# Patient Record
Sex: Male | Born: 1950 | Race: White | Hispanic: No | Marital: Married | Smoking: Never smoker
Health system: Southern US, Community
[De-identification: ages and names within clinical notes are randomized; demographics above are authoritative.]

## PROBLEM LIST (undated history)

## (undated) DIAGNOSIS — I499 Cardiac arrhythmia, unspecified: Secondary | ICD-10-CM

## (undated) DIAGNOSIS — Z87442 Personal history of urinary calculi: Secondary | ICD-10-CM

## (undated) DIAGNOSIS — K219 Gastro-esophageal reflux disease without esophagitis: Secondary | ICD-10-CM

## (undated) DIAGNOSIS — M1990 Unspecified osteoarthritis, unspecified site: Secondary | ICD-10-CM

## (undated) DIAGNOSIS — I10 Essential (primary) hypertension: Secondary | ICD-10-CM

## (undated) DIAGNOSIS — D649 Anemia, unspecified: Secondary | ICD-10-CM

## (undated) DIAGNOSIS — M1711 Unilateral primary osteoarthritis, right knee: Secondary | ICD-10-CM

## (undated) DIAGNOSIS — Z8619 Personal history of other infectious and parasitic diseases: Secondary | ICD-10-CM

## (undated) HISTORY — DX: Essential (primary) hypertension: I10

## (undated) HISTORY — PX: TONSILLECTOMY: SUR1361

## (undated) HISTORY — PX: OTHER SURGICAL HISTORY: SHX169

## (undated) HISTORY — PX: CYST EXCISION: SHX5701

## (undated) HISTORY — PX: KNEE ARTHROSCOPY: SUR90

---

## 2016-03-21 ENCOUNTER — Encounter (INDEPENDENT_AMBULATORY_CARE_PROVIDER_SITE_OTHER): Payer: Self-pay | Admitting: Internal Medicine

## 2016-03-22 ENCOUNTER — Other Ambulatory Visit (INDEPENDENT_AMBULATORY_CARE_PROVIDER_SITE_OTHER): Payer: Self-pay | Admitting: Internal Medicine

## 2016-03-22 ENCOUNTER — Encounter (INDEPENDENT_AMBULATORY_CARE_PROVIDER_SITE_OTHER): Payer: Self-pay | Admitting: *Deleted

## 2016-03-22 ENCOUNTER — Ambulatory Visit (INDEPENDENT_AMBULATORY_CARE_PROVIDER_SITE_OTHER): Payer: Medicare Other | Admitting: Internal Medicine

## 2016-03-22 ENCOUNTER — Telehealth (INDEPENDENT_AMBULATORY_CARE_PROVIDER_SITE_OTHER): Payer: Self-pay | Admitting: *Deleted

## 2016-03-22 ENCOUNTER — Encounter (INDEPENDENT_AMBULATORY_CARE_PROVIDER_SITE_OTHER): Payer: Self-pay | Admitting: Internal Medicine

## 2016-03-22 ENCOUNTER — Encounter (INDEPENDENT_AMBULATORY_CARE_PROVIDER_SITE_OTHER): Payer: Self-pay

## 2016-03-22 VITALS — BP 180/80 | HR 72 | Temp 98.2°F | Ht 71.0 in | Wt 260.0 lb

## 2016-03-22 DIAGNOSIS — D62 Acute posthemorrhagic anemia: Secondary | ICD-10-CM

## 2016-03-22 DIAGNOSIS — R195 Other fecal abnormalities: Secondary | ICD-10-CM

## 2016-03-22 DIAGNOSIS — Z800 Family history of malignant neoplasm of digestive organs: Secondary | ICD-10-CM

## 2016-03-22 DIAGNOSIS — Z860100 Personal history of colon polyps, unspecified: Secondary | ICD-10-CM

## 2016-03-22 DIAGNOSIS — I10 Essential (primary) hypertension: Secondary | ICD-10-CM | POA: Insufficient documentation

## 2016-03-22 HISTORY — DX: Essential (primary) hypertension: I10

## 2016-03-22 MED ORDER — PEG 3350-KCL-NA BICARB-NACL 420 G PO SOLR: 4000.0000 mL | Freq: Once | ORAL | 0 refills | Status: AC

## 2016-03-22 NOTE — Patient Instructions (Signed)
EGD/Colonoscopy. The risks and benefits such as perforation, bleeding, and infection were reviewed with the patient and is agreeable. 

## 2016-03-22 NOTE — Telephone Encounter (Signed)
Patient needs trilyte 

## 2016-03-22 NOTE — Progress Notes (Signed)
   Subjective:    Patient ID: Jack Elliott, male    DOB: Nov 09, 1950, 65 y.o.   MRN: [id number]  HPI Referred by Shari Prows FNP for anemia, heme positive stools.(2). He denies prior hx of IDA.  He denies seeing any rectal bleeding.  Thee has been no weight loss. He usually has a BM x 2 a day.  Family hx of colon cancer in an aunt an uncle. Mother had a colon cancer (small intestines). Age 91 02/18/2016 Ferritin 3.5. 02/18/2016 H and H 12.2 and 38.9, MCV 80.9,  platelet ct 219 He says he would take Naproxen about 3 times a week when he worked in the year for arthritis in his knees.  His last colonoscopy was in August of 2014 by DR. Spainhour.  Family hx of colon cancer. Hx of adenoma.  Next colonoscopy in 5 yrs.  Biopsy: exhibits polypoid fragments with prominent adenomatous change. No tumor seen . Review of Systems Past Medical History:  Diagnosis Date  . Essential hypertension 03/22/2016    Past Surgical History:  Procedure Laterality Date  . kidney stone surgery with laser      Allergies  Allergen Reactions  . Penicillins     Rash,     No current outpatient prescriptions on file prior to visit.   No current facility-administered medications on file prior to visit.    Current Outpatient Prescriptions  Medication Sig Dispense Refill  . cholecalciferol (VITAMIN D) 1000 units tablet Take 1,000 Units by mouth daily.    Marland Kitchen Fe Bisgly-Succ-C-Thre-B12-FA (IRON-150 PO) Take 64 mg by mouth.    Marland Kitchen HYDROcodone-acetaminophen (NORCO/VICODIN) 5-325 MG tablet Take 1 tablet by mouth every 6 (six) hours as needed for moderate pain.    . naproxen (NAPROSYN) 500 MG tablet Take 500 mg by mouth as needed.    . ranitidine (ZANTAC) 75 MG tablet Take 75 mg by mouth as needed for heartburn.    . valsartan-hydrochlorothiazide (DIOVAN-HCT) 80-12.5 MG tablet Take 1 tablet by mouth daily.    . vitamin B-12 (CYANOCOBALAMIN) 250 MCG tablet Take 2,500 mcg by mouth daily.     No current  facility-administered medications for this visit.        Objective:   Physical Exam Blood pressure (!) 180/80, pulse 72, temperature 98.2 F (36.8 C), height 5\' 11"  (1.803 m), weight 260 lb (117.9 kg). Alert and oriented. Skin warm and dry. Oral mucosa is moist.   . Sclera anicteric, conjunctivae is pink. Thyroid not enlarged. No cervical lymphadenopathy. Lungs clear. Heart regular rate and rhythm.  Abdomen is soft. Bowel sounds are positive. No hepatomegaly. No abdominal masses felt. No tenderness.  No edema to lower extremities.         Assessment & Plan:  Anemia, Guaiac positive stool. Family hx of colon cancer.  Hx of colon adenoma  I discussed with Dr. Laural Golden. EGD/Colonoscopy. The risks and benefits such as perforation, bleeding, and infection were reviewed with the patient and is agreeable.

## 2016-03-23 DIAGNOSIS — Z800 Family history of malignant neoplasm of digestive organs: Secondary | ICD-10-CM | POA: Insufficient documentation

## 2016-03-23 DIAGNOSIS — Z86010 Personal history of colonic polyps: Secondary | ICD-10-CM | POA: Insufficient documentation

## 2016-03-23 DIAGNOSIS — R195 Other fecal abnormalities: Secondary | ICD-10-CM | POA: Insufficient documentation

## 2016-03-23 DIAGNOSIS — D62 Acute posthemorrhagic anemia: Secondary | ICD-10-CM | POA: Insufficient documentation

## 2016-04-29 ENCOUNTER — Encounter (HOSPITAL_COMMUNITY): Payer: Self-pay | Admitting: *Deleted

## 2016-04-29 ENCOUNTER — Encounter (HOSPITAL_COMMUNITY)
Admission: RE | Disposition: A | Discharge status: Home / Self Care | Payer: Self-pay | Source: Ambulatory Visit | Attending: Internal Medicine

## 2016-04-29 ENCOUNTER — Ambulatory Visit (HOSPITAL_COMMUNITY)
Admission: RE | Admit: 2016-04-29 | Discharge: 2016-04-29 | Disposition: A | Discharge status: Home / Self Care | Payer: Medicare Other | Source: Ambulatory Visit | Attending: Internal Medicine | Admitting: Internal Medicine

## 2016-04-29 DIAGNOSIS — R195 Other fecal abnormalities: Secondary | ICD-10-CM | POA: Diagnosis not present

## 2016-04-29 DIAGNOSIS — Z860100 Personal history of colon polyps, unspecified: Secondary | ICD-10-CM

## 2016-04-29 DIAGNOSIS — D62 Acute posthemorrhagic anemia: Secondary | ICD-10-CM

## 2016-04-29 DIAGNOSIS — K644 Residual hemorrhoidal skin tags: Secondary | ICD-10-CM | POA: Insufficient documentation

## 2016-04-29 DIAGNOSIS — Z79899 Other long term (current) drug therapy: Secondary | ICD-10-CM | POA: Diagnosis not present

## 2016-04-29 DIAGNOSIS — D509 Iron deficiency anemia, unspecified: Secondary | ICD-10-CM | POA: Insufficient documentation

## 2016-04-29 DIAGNOSIS — I10 Essential (primary) hypertension: Secondary | ICD-10-CM | POA: Diagnosis not present

## 2016-04-29 DIAGNOSIS — Z791 Long term (current) use of non-steroidal anti-inflammatories (NSAID): Secondary | ICD-10-CM | POA: Diagnosis not present

## 2016-04-29 DIAGNOSIS — K5730 Diverticulosis of large intestine without perforation or abscess without bleeding: Secondary | ICD-10-CM | POA: Diagnosis not present

## 2016-04-29 DIAGNOSIS — Z800 Family history of malignant neoplasm of digestive organs: Secondary | ICD-10-CM

## 2016-04-29 HISTORY — PX: ESOPHAGOGASTRODUODENOSCOPY: SHX5428

## 2016-04-29 HISTORY — PX: COLONOSCOPY: SHX5424

## 2016-04-29 SURGERY — EGD (ESOPHAGOGASTRODUODENOSCOPY)
Anesthesia: Moderate Sedation

## 2016-04-29 MED ORDER — SODIUM CHLORIDE 0.9 % IV SOLN
INTRAVENOUS | Status: DC
  Administered 2016-04-29: 1000 mL via INTRAVENOUS

## 2016-04-29 MED ORDER — MIDAZOLAM HCL 5 MG/5ML IJ SOLN
INTRAMUSCULAR | Status: AC
  Filled 2016-04-29: qty 10

## 2016-04-29 MED ORDER — MIDAZOLAM HCL 5 MG/5ML IJ SOLN
INTRAMUSCULAR | Status: DC | PRN
  Administered 2016-04-29: 2 mg via INTRAVENOUS
  Administered 2016-04-29: 1 mg via INTRAVENOUS
  Administered 2016-04-29: 2 mg via INTRAVENOUS

## 2016-04-29 MED ORDER — MEPERIDINE HCL 50 MG/ML IJ SOLN
INTRAMUSCULAR | Status: DC | PRN
  Administered 2016-04-29 (×2): 25 mg via INTRAVENOUS

## 2016-04-29 MED ORDER — BUTAMBEN-TETRACAINE-BENZOCAINE 2-2-14 % EX AERO
INHALATION_SPRAY | CUTANEOUS | Status: DC | PRN
  Administered 2016-04-29: 2 via TOPICAL

## 2016-04-29 MED ORDER — MEPERIDINE HCL 50 MG/ML IJ SOLN
INTRAMUSCULAR | Status: AC
  Filled 2016-04-29: qty 1

## 2016-04-29 NOTE — H&P (Addendum)
Jack Elliott is an 65 y.o. male.   Chief Complaint: Patient is here for EGD and colonoscopy. HPI: 65 year old Caucasian male who was recently found to have heme-positive stool and iron deficiency anemia and he has responded to by mouth iron. He denies melena or rectal bleeding. There is no history of peptic ulcer disease but he had been taking Naprosyn when necessary basis. He rarely takes more than 3 times a week.. He has had occasional heartburn. He denies dysphagia nausea vomiting anorexia weight loss. Os colonoscopy was about 3 years ago with one of single tubular adenoma. History significant for CRC in paternal uncle was diagnosed in his 21s.  Past Medical History:  Diagnosis Date  . Essential hypertension 03/22/2016    Past Surgical History:  Procedure Laterality Date  . kidney stone surgery with laser    . KNEE ARTHROSCOPY      Family History  Problem Relation Age of Onset  . Multiple sclerosis Mother   . Breast cancer Mother   . Bladder Cancer Father   . Emphysema Father    Social History:  reports that he has never smoked. He has never used smokeless tobacco. He reports that he does not drink alcohol or use drugs.  Allergies:  Allergies  Allergen Reactions  . Penicillins Rash    Has patient had a PCN reaction causing immediate rash, facial/tongue/throat swelling, SOB or lightheadedness with hypotension: Yes Has patient had a PCN reaction causing severe rash involving mucus membranes or skin necrosis: No Has patient had a PCN reaction that required hospitalization No Has patient had a PCN reaction occurring within the last 10 years: No If all of the above answers are "NO", then may proceed with Cephalosporin use.     Medications Prior to Admission  Medication Sig Dispense Refill  . cholecalciferol (VITAMIN D) 1000 units tablet Take 1,000 Units by mouth daily.    . Cyanocobalamin (B-12) 2500 MCG TABS Take 2,500 mcg by mouth daily.    . ferrous sulfate 325 (65 FE) MG  tablet Take 325 mg by mouth at bedtime.     . naproxen (NAPROSYN) 500 MG tablet Take 500 mg by mouth every other day.     . ranitidine (ZANTAC) 75 MG tablet Take 75 mg by mouth as needed for heartburn.    . valsartan-hydrochlorothiazide (DIOVAN-HCT) 80-12.5 MG tablet Take 1 tablet by mouth daily.    . vitamin B-12 (CYANOCOBALAMIN) 250 MCG tablet Take 2,500 mcg by mouth daily.    Marland Kitchen HYDROcodone-acetaminophen (NORCO/VICODIN) 5-325 MG tablet Take 1 tablet by mouth every 6 (six) hours as needed for moderate pain.      No results found for this or any previous visit (from the past 48 hour(s)). No results found.  ROS  Blood pressure 140/61, pulse 99, temperature 98.7 F (37.1 C), temperature source Oral, resp. rate 20, height 5\' 11"  (1.803 m), weight 249 lb (112.9 kg), SpO2 97 %. Physical Exam  Constitutional: He appears well-developed and well-nourished.  HENT:  Mouth/Throat: Oropharynx is clear and moist.  Eyes: Conjunctivae are normal. No scleral icterus.  Neck: No thyromegaly present.  Cardiovascular: Normal rate, regular rhythm and normal heart sounds.   No murmur heard. Respiratory: Effort normal and breath sounds normal.  GI:  Abdomen is full but soft and nontender without organomegaly or masses.  Lymphadenopathy:    He has no cervical adenopathy.     Assessment/Plan Heme positive stool. Iron deficiency anemia. History of colonic adenoma. Diagnostic EGD and colonoscopy.  [employer],  MD 04/29/2016, 2:43 PM

## 2016-04-29 NOTE — Discharge Instructions (Signed)
Resume usual medications and high fiber diet. Use Naprosyn and Zantac on as-needed basis. Notify if you have rectal bleeding or black or tarry stools. CBC and stool guaiac when you see your primary care physician in February 2018. No driving for 24 hours. Next colonoscopy in 5 years.      Esophagogastroduodenoscopy, Care After Refer to this sheet in the next few weeks. These instructions provide you with information about caring for yourself after your procedure. Your health care provider may also give you more specific instructions. Your treatment has been planned according to current medical practices, but problems sometimes occur. Call your health care provider if you have any problems or questions after your procedure. WHAT TO EXPECT AFTER THE PROCEDURE After your procedure, it is typical to feel:  Soreness in your throat.  Pain with swallowing.  Sick to your stomach (nauseous).  Bloated.  Dizzy.  Fatigued. HOME CARE INSTRUCTIONS  Do not eat or drink anything until the numbing medicine (local anesthetic) has worn off and your gag reflex has returned. You will know that the local anesthetic has worn off when you can swallow comfortably.  Do not drive or operate machinery until directed by your health care provider.  Take medicines only as directed by your health care provider. SEEK MEDICAL CARE IF:   You cannot stop coughing.  You are not urinating at all or less than usual. SEEK IMMEDIATE MEDICAL CARE IF:  You have difficulty swallowing.  You cannot eat or drink.  You have worsening throat or chest pain.  You have dizziness or lightheadedness or you faint.  You have nausea or vomiting.  You have chills.  You have a fever.  You have severe abdominal pain.  You have black, tarry, or bloody stools.   This information is not intended to replace advice given to you by your health care provider. Make sure you discuss any questions you have with your health  care provider.   Document Released: 06/13/2012 Document Revised: 07/18/2014 Document Reviewed: 06/13/2012 Elsevier Interactive Patient Education 2016 [employer].     Colonoscopy, Care After These instructions give you information on caring for yourself after your procedure. Your doctor may also give you more specific instructions. Call your doctor if you have any problems or questions after your procedure. HOME CARE  Do not drive for 24 hours.  Do not sign important papers or use machinery for 24 hours.  You may shower.  You may go back to your usual activities, but go slower for the first 24 hours.  Take rest breaks often during the first 24 hours.  Walk around or use warm packs on your belly (abdomen) if you have belly cramping or gas.  Drink enough fluids to keep your pee (urine) clear or pale yellow.  Resume your normal diet. Avoid heavy or fried foods.  Avoid drinking alcohol for 24 hours or as told by your doctor.  Only take medicines as told by your doctor. If a tissue sample (biopsy) was taken during the procedure:   Do not take aspirin or blood thinners for 7 days, or as told by your doctor.  Do not drink alcohol for 7 days, or as told by your doctor.  Eat soft foods for the first 24 hours. GET HELP IF: You still have a small amount of blood in your poop (stool) 2-3 days after the procedure. GET HELP RIGHT AWAY IF:  You have more than a small amount of blood in your poop.  You see  clumps of tissue (blood clots) in your poop.  Your belly is puffy (swollen).  You feel sick to your stomach (nauseous) or throw up (vomit).  You have a fever.  You have belly pain that gets worse and medicine does not help. MAKE SURE YOU:  Understand these instructions.  Will watch your condition.  Will get help right away if you are not doing well or get worse.   This information is not intended to replace advice given to you by your health care provider. Make sure  you discuss any questions you have with your health care provider.   Document Released: 07/30/2010 Document Revised: 07/02/2013 Document Reviewed: 03/04/2013 Elsevier Interactive Patient Education [employer].    Diverticulosis Diverticulosis is the condition that develops when small pouches (diverticula) form in the wall of your colon. Your colon, or large intestine, is where water is absorbed and stool is formed. The pouches form when the inside layer of your colon pushes through weak spots in the outer layers of your colon. CAUSES  No one knows exactly what causes diverticulosis. RISK FACTORS  Being older than 8. Your risk for this condition increases with age. Diverticulosis is rare in people younger than 40 years. By age 45, almost everyone has it.  Eating a low-fiber diet.  Being frequently constipated.  Being overweight.  Not getting enough exercise.  Smoking.  Taking over-the-counter pain medicines, like aspirin and ibuprofen. SYMPTOMS  Most people with diverticulosis do not have symptoms. DIAGNOSIS  Because diverticulosis often has no symptoms, health care providers often discover the condition during an exam for other colon problems. In many cases, a health care provider will diagnose diverticulosis while using a flexible scope to examine the colon (colonoscopy). TREATMENT  If you have never developed an infection related to diverticulosis, you may not need treatment. If you have had an infection before, treatment may include:  Eating more fruits, vegetables, and grains.  Taking a fiber supplement.  Taking a live bacteria supplement (probiotic).  Taking medicine to relax your colon. HOME CARE INSTRUCTIONS   Drink at least 6-8 glasses of water each day to prevent constipation.  Try not to strain when you have a bowel movement.  Keep all follow-up appointments. If you have had an infection before:  Increase the fiber in your diet as directed by your  health care provider or dietitian.  Take a dietary fiber supplement if your health care provider approves.  Only take medicines as directed by your health care provider. SEEK MEDICAL CARE IF:   You have abdominal pain.  You have bloating.  You have cramps.  You have not gone to the bathroom in 3 days. SEEK IMMEDIATE MEDICAL CARE IF:   Your pain gets worse.  Yourbloating becomes very bad.  You have a fever or chills, and your symptoms suddenly get worse.  You begin vomiting.  You have bowel movements that are bloody or black. MAKE SURE YOU:  Understand these instructions.  Will watch your condition.  Will get help right away if you are not doing well or get worse.   This information is not intended to replace advice given to you by your health care provider. Make sure you discuss any questions you have with your health care provider.   Document Released: 03/24/2004 Document Revised: 07/02/2013 Document Reviewed: 05/22/2013 Elsevier Interactive Patient Education 2016 Elsevier Inc.    High-Fiber Diet Fiber, also called dietary fiber, is a type of carbohydrate found in fruits, vegetables, whole grains, and  beans. A high-fiber diet can have many health benefits. Your health care provider may recommend a high-fiber diet to help:  Prevent constipation. Fiber can make your bowel movements more regular.  Lower your cholesterol.  Relieve hemorrhoids, uncomplicated diverticulosis, or irritable bowel syndrome.  Prevent overeating as part of a weight-loss plan.  Prevent heart disease, type 2 diabetes, and certain cancers. WHAT IS MY PLAN? The recommended daily intake of fiber includes:  38 grams for men under age 57.  49 grams for men over age 83.  34 grams for women under age 56.  35 grams for women over age 61. You can get the recommended daily intake of dietary fiber by eating a variety of fruits, vegetables, grains, and beans. Your health care provider may also  recommend a fiber supplement if it is not possible to get enough fiber through your diet. WHAT DO I NEED TO KNOW ABOUT A HIGH-FIBER DIET?  Fiber supplements have not been widely studied for their effectiveness, so it is better to get fiber through food sources.  Always check the fiber content on thenutrition facts label of any prepackaged food. Look for foods that contain at least 5 grams of fiber per serving.  Ask your dietitian if you have questions about specific foods that are related to your condition, especially if those foods are not listed in the following section.  Increase your daily fiber consumption gradually. Increasing your intake of dietary fiber too quickly may cause bloating, cramping, or gas.  Drink plenty of water. Water helps you to digest fiber. WHAT FOODS CAN I EAT? Grains Whole-grain breads. Multigrain cereal. Oats and oatmeal. Brown rice. Barley. Bulgur wheat. [place]. Bran muffins. Popcorn. Rye wafer crackers. Vegetables Sweet potatoes. Spinach. Kale. Artichokes. Cabbage. Broccoli. Green peas. Carrots. Squash. Fruits Berries. Pears. Apples. Oranges. Avocados. Prunes and raisins. Dried figs. Meats and Other Protein Sources [place], kidney, pinto, and soy beans. Split peas. Lentils. Nuts and seeds. Dairy Fiber-fortified yogurt. Beverages Fiber-fortified soy milk. Fiber-fortified orange juice. Other Fiber bars. The items listed above may not be a complete list of recommended foods or beverages. Contact your dietitian for more options. WHAT FOODS ARE NOT RECOMMENDED? Grains White bread. Pasta made with refined flour. White rice. Vegetables Fried potatoes. Canned vegetables. Well-cooked vegetables.  Fruits Fruit juice. Cooked, strained fruit. Meats and Other Protein Sources Fatty cuts of meat. Fried Sales executive or fried fish. Dairy Milk. Yogurt. Cream cheese. Sour cream. Beverages Soft drinks. Other Cakes and pastries. Butter and oils. The items listed above  may not be a complete list of foods and beverages to avoid. Contact your dietitian for more information. WHAT ARE SOME TIPS FOR INCLUDING HIGH-FIBER FOODS IN MY DIET?  Eat a wide variety of high-fiber foods.  Make sure that half of all grains consumed each day are whole grains.  Replace breads and cereals made from refined flour or white flour with whole-grain breads and cereals.  Replace white rice with brown rice, bulgur wheat, or millet.  Start the day with a breakfast that is high in fiber, such as a cereal that contains at least 5 grams of fiber per serving.  Use beans in place of meat in soups, salads, or pasta.  Eat high-fiber snacks, such as berries, raw vegetables, nuts, or popcorn.   This information is not intended to replace advice given to you by your health care provider. Make sure you discuss any questions you have with your health care provider.   Document Released: 06/27/2005 Document Revised: 07/18/2014 Document  Reviewed: 12/10/2013 °Elsevier Interactive Patient Education ©2016 Elsevier Inc. ° °

## 2016-04-29 NOTE — Op Note (Signed)
[facility] Patient Name: Jack Elliott Procedure Date: 04/29/2016 3:03 PM MRN: [id number] Date of Birth: 1951-03-30 Attending MD: Hildred Laser , MD CSN: [id number] Age: 65 Admit Type: Outpatient Procedure:                Colonoscopy Indications:              Heme positive stool, Iron deficiency anemia Providers:                Hildred Laser, MD, Janeece Riggers, RN, [facility],                            Technician Referring MD:             Laray Anger NP, NP Medicines:                Midazolam 1 mg IV Complications:            No immediate complications. Estimated Blood Loss:     Estimated blood loss: none. Estimated blood loss:                            none. Procedure:                Pre-Anesthesia Assessment:                           - Prior to the procedure, a History and Physical                            was performed, and patient medications and                            allergies were reviewed. The patient's tolerance of                            previous anesthesia was also reviewed. The risks                            and benefits of the procedure and the sedation                            options and risks were discussed with the patient.                            All questions were answered, and informed consent                            was obtained. Prior Anticoagulants: The patient                            last took naproxen 3 days prior to the procedure.                            ASA Grade Assessment: II - A patient with mild  systemic disease. After reviewing the risks and                            benefits, the patient was deemed in satisfactory                            condition to undergo the procedure.                           After obtaining informed consent, the colonoscope                            was passed under direct vision. Throughout the                            procedure, the patient's blood  pressure, pulse, and                            oxygen saturations were monitored continuously. The                            [id number]) was introduced through the                            anus and advanced to the the terminal ileum, with                            identification of the appendiceal orifice and IC                            valve. The colonoscopy was performed without                            difficulty. The patient tolerated the procedure                            well. The quality of the bowel preparation was                            good. The terminal ileum, ileocecal valve,                            appendiceal orifice, and rectum were photographed. Scope In: 3:04:55 PM Scope Out: 3:21:38 PM Scope Withdrawal Time: 0 hours 12 minutes 28 seconds  Total Procedure Duration: 0 hours 16 minutes 43 seconds  Findings:      The terminal ileum appeared normal.      Scattered medium-mouthed diverticula were found in the sigmoid colon.      The exam was otherwise without abnormality.      External hemorrhoids were found during retroflexion. The hemorrhoids       were small. Impression:               - Diverticulosis in the sigmoid colon.                           -  The examination was otherwise normal.                           - External hemorrhoids.                           - No specimens collected. Moderate Sedation:      Moderate (conscious) sedation was administered by the endoscopy nurse       and supervised by the endoscopist. The following parameters were       monitored: oxygen saturation, heart rate, blood pressure, CO2       capnography and response to care. Total physician intraservice time was       20 minutes. Recommendation:           - Patient has a contact number available for                            emergencies. The signs and symptoms of potential                            delayed complications were discussed with the                             patient. Return to normal activities tomorrow.                            Written discharge instructions were provided to the                            patient.                           - High fiber diet today.                           - Continue present medications.                           - Repeat colonoscopy in 5 years for surveillance.                           - H&H and stool guaiacin February 2018 at th.                            Vira Agar FNP.                           Comment: He could've been losing blood from his                            upper or mid GI tract due to chronic NSAID use.                           If he has an evidence of overt GI bleed or H&H  drops again will consider small bowel Given capsule                            study. Procedure Code(s):        --- Professional ---                           [id number], Colonoscopy, flexible; diagnostic, including                            collection of specimen(s) by brushing or washing,                            when performed (separate procedure)                           99152, Moderate sedation services provided by the                            same physician or other qualified health care                            professional performing the diagnostic or                            therapeutic service that the sedation supports,                            requiring the presence of an independent trained                            observer to assist in the monitoring of the                            patient's level of consciousness and physiological                            status; initial 15 minutes of intraservice time,                            patient age 40 years or older Diagnosis Code(s):        --- Professional ---                           K64.4, Residual hemorrhoidal skin tags                           R19.5, Other fecal abnormalities                            D50.9, Iron deficiency anemia, unspecified                           K57.30, Diverticulosis of large intestine without  perforation or abscess without bleeding CPT copyright 2016 American Medical Association. All rights reserved. The codes documented in this report are preliminary and upon coder review may  be revised to meet current compliance requirements. Hildred Laser, MD Hildred Laser, MD 04/29/2016 3:50:13 PM This report has been signed electronically. Number of Addenda: 0

## 2016-04-29 NOTE — Progress Notes (Signed)
Call to [facility] Compounding [phone] at patient and patient's wife request.  Dr. Laural Golden verbally approved for patient to have a compounded medication that lists Ketoprofen, Cyclobenazaprine, Bupivacaine and Lidocaine compounded : to affected areas as needed topically.  Spoke with Ed pharmacist.  Pharmacist will contact patient and patient's wife.

## 2016-04-29 NOTE — Op Note (Signed)
[facility] Patient Name: Jack Elliott Procedure Date: 04/29/2016 2:31 PM MRN: [id number] Date of Birth: 06-Apr-1951 Attending MD: Hildred Laser , MD CSN: [id number] Age: 65 Admit Type: Outpatient Procedure:                Upper GI endoscopy Indications:              Iron deficiency anemia, Heme positive stool Providers:                Hildred Laser, MD, Janeece Riggers, RN, Purcell Nails. [place],                            [employer] Referring MD:             Laray Anger, NP Medicines:                Cetacaine spray, Meperidine 50 mg IV, Midazolam 4                            mg IV Complications:            No immediate complications. Estimated Blood Loss:     Estimated blood loss: none. Procedure:                Pre-Anesthesia Assessment:                           - Prior to the procedure, a History and Physical                            was performed, and patient medications and                            allergies were reviewed. The patient's tolerance of                            previous anesthesia was also reviewed. The risks                            and benefits of the procedure and the sedation                            options and risks were discussed with the patient.                            All questions were answered, and informed consent                            was obtained. Prior Anticoagulants: The patient                            last took naproxen 3 days prior to the procedure.                            ASA Grade Assessment: II - A patient with mild  systemic disease. After reviewing the risks and                            benefits, the patient was deemed in satisfactory                            condition to undergo the procedure.                           After obtaining informed consent, the endoscope was                            passed under direct vision. Throughout the                            procedure, the  patient's blood pressure, pulse, and                            oxygen saturations were monitored continuously. The                            [id number] scope was introduced through the                            mouth, and advanced to the second part of duodenum.                            The upper GI endoscopy was accomplished without                            difficulty. The patient tolerated the procedure                            well. Scope In: 2:55:23 PM Scope Out: 3:01:36 PM Total Procedure Duration: 0 hours 6 minutes 13 seconds  Findings:      The examined esophagus was normal.      The Z-line was regular and was found 41 cm from the incisors.      The entire examined stomach was normal.      The duodenal bulb and second portion of the duodenum were normal. Impression:               - Normal esophagus.                           - Z-line regular, 41 cm from the incisors.                           - Normal stomach.                           - Normal duodenal bulb and second portion of the                            duodenum.                           -  No specimens collected. Moderate Sedation:      Moderate (conscious) sedation was administered by the endoscopy nurse       and supervised by the endoscopist. The following parameters were       monitored: oxygen saturation, heart rate, blood pressure, CO2       capnography and response to care. Total physician intraservice time was       13 minutes. Recommendation:           - Patient has a contact number available for                            emergencies. The signs and symptoms of potential                            delayed complications were discussed with the                            patient. Return to normal activities tomorrow.                            Written discharge instructions were provided to the                            patient.                           - Resume previous diet today.                            - Continue present medications.                           - Use Naprosyn and OTC Zantac                           - call if you have tarry stool or rectal bleeding.                           - See the other procedure note for documentation of                            additional recommendations. Procedure Code(s):        --- Professional ---                           [phone], Esophagogastroduodenoscopy, flexible,                            transoral; diagnostic, including collection of                            specimen(s) by brushing or washing, when performed                            (separate procedure)  [id number], Moderate sedation services provided by the                            same physician or other qualified health care                            professional performing the diagnostic or                            therapeutic service that the sedation supports,                            requiring the presence of an independent trained                            observer to assist in the monitoring of the                            patient's level of consciousness and physiological                            status; initial 15 minutes of intraservice time,                            patient age 10 years or older Diagnosis Code(s):        --- Professional ---                           D50.9, Iron deficiency anemia, unspecified                           R19.5, Other fecal abnormalities CPT copyright 2016 American Medical Association. All rights reserved. The codes documented in this report are preliminary and upon coder review may  be revised to meet current compliance requirements. Hildred Laser, MD Hildred Laser, MD 04/29/2016 3:37:05 PM This report has been signed electronically. Number of Addenda: 0

## 2016-05-05 ENCOUNTER — Encounter (HOSPITAL_COMMUNITY): Payer: Self-pay | Admitting: Internal Medicine

## 2018-01-18 NOTE — Pre-Procedure Instructions (Signed)
Jack Elliott [facility]  01/18/2018      [id number] - [place] [address] Phone: [phone] Fax: [phone]    Your procedure is scheduled on July 23rd.  Report to [facility] Admitting at 11:00 A.M.  Call this number if you have problems the morning of surgery:  [phone]   Remember:  Do not eat or drink after midnight.   Continue all medications as directed by your physician except follow these medication instructions before surgery below   Take these medicines the morning of surgery with A SIP OF WATER: NONE   7 days prior to surgery STOP taking any Aspirin(unless otherwise instructed by your surgeon), Aleve, Naproxen, Ibuprofen, Motrin, Advil, Goody's, BC's, all herbal medications, fish oil, and all vitamins    Do not wear jewelry.  Do not wear lotions, powders, or colognes, or deodorant.  Men may shave face and neck.  Do not bring valuables to the hospital.  [facility] is not responsible for any belongings or valuables.  Hearing aids, eyeglasses, contacts, dentures or bridgework may not be worn into surgery.  Leave your suitcase in the car.  After surgery it may be brought to your room.  For patients admitted to the hospital, discharge time will be determined by your treatment team.  Patients discharged the day of surgery will not be allowed to drive home.    [place]- Preparing For Surgery  Before surgery, you can play an important role. Because skin is not sterile, your skin needs to be as free of germs as possible. You can reduce the number of germs on your skin by washing with CHG (chlorahexidine gluconate) Soap before surgery.  CHG is an antiseptic cleaner which kills germs and bonds with the skin to continue killing germs even after washing.    Oral Hygiene is also important to reduce your risk of infection.  Remember - BRUSH YOUR TEETH THE MORNING OF SURGERY  WITH YOUR REGULAR TOOTHPASTE  Please do not use if you have an allergy to CHG or antibacterial soaps. If your skin becomes reddened/irritated stop using the CHG.  Do not shave (including legs and underarms) for at least 48 hours prior to first CHG shower. It is OK to shave your face.  Please follow these instructions carefully.   1. Shower the NIGHT BEFORE SURGERY and the MORNING OF SURGERY with CHG.   2. If you chose to wash your hair, wash your hair first as usual with your normal shampoo.  3. After you shampoo, rinse your hair and body thoroughly to remove the shampoo.  4. Use CHG as you would any other liquid soap. You can apply CHG directly to the skin and wash gently with a scrungie or a clean washcloth.   5. Apply the CHG Soap to your body ONLY FROM THE NECK DOWN.  Do not use on open wounds or open sores. Avoid contact with your eyes, ears, mouth and genitals (private parts). Wash Face and genitals (private parts)  with your normal soap.  6. Wash thoroughly, paying special attention to the area where your surgery will be performed.  7. Thoroughly rinse your body with warm water from the neck down.  8. DO NOT shower/wash with your normal soap after using and rinsing off the CHG Soap.  9. Pat yourself dry with a CLEAN TOWEL.  10. Wear CLEAN PAJAMAS to bed the night before  surgery, wear comfortable clothes the morning of surgery  11. Place CLEAN SHEETS on your bed the night of your first shower and DO NOT SLEEP WITH PETS.    Day of Surgery:  Do not apply any deodorants/lotions.  Please wear clean clothes to the hospital/surgery center.   Remember to brush your teeth WITH YOUR REGULAR TOOTHPASTE.   Please read over the following fact sheets that you were given.

## 2018-01-19 ENCOUNTER — Other Ambulatory Visit: Payer: Self-pay

## 2018-01-19 ENCOUNTER — Encounter (HOSPITAL_COMMUNITY): Payer: Self-pay

## 2018-01-19 ENCOUNTER — Encounter (HOSPITAL_COMMUNITY)
Admission: RE | Admit: 2018-01-19 | Discharge: 2018-01-19 | Disposition: A | Discharge status: Home / Self Care | Payer: Medicare Other | Source: Ambulatory Visit | Attending: Orthopedic Surgery | Admitting: Orthopedic Surgery

## 2018-01-19 ENCOUNTER — Other Ambulatory Visit (HOSPITAL_COMMUNITY): Payer: Medicare Other

## 2018-01-19 DIAGNOSIS — Z01812 Encounter for preprocedural laboratory examination: Secondary | ICD-10-CM | POA: Insufficient documentation

## 2018-01-19 HISTORY — DX: Personal history of other infectious and parasitic diseases: Z86.19

## 2018-01-19 HISTORY — DX: Personal history of urinary calculi: Z87.442

## 2018-01-19 HISTORY — DX: Cardiac arrhythmia, unspecified: I49.9

## 2018-01-19 HISTORY — DX: Gastro-esophageal reflux disease without esophagitis: K21.9

## 2018-01-19 HISTORY — DX: Anemia, unspecified: D64.9

## 2018-01-19 HISTORY — DX: Unspecified osteoarthritis, unspecified site: M19.90

## 2018-01-19 LAB — BASIC METABOLIC PANEL
ANION GAP: 12 (ref 5–15)
BUN: 19 mg/dL (ref 8–23)
CHLORIDE: 106 mmol/L (ref 98–111)
CO2: 20 mmol/L — AB (ref 22–32)
Calcium: 9 mg/dL (ref 8.9–10.3)
Creatinine, Ser: 0.76 mg/dL (ref 0.61–1.24)
GFR calc Af Amer: >60 mL/min (ref >60)
GFR calc non Af Amer: >60 mL/min (ref >60)
Glucose, Bld: 107 mg/dL — ABNORMAL HIGH (ref 70–99)
Potassium: 3.8 mmol/L (ref 3.5–5.1)
Sodium: 138 mmol/L (ref 135–145)

## 2018-01-19 LAB — GLUCOSE, CAPILLARY: GLUCOSE-CAPILLARY: 109 mg/dL — AB (ref 70–99)

## 2018-01-19 LAB — CBC
HCT: 52.3 % — ABNORMAL HIGH (ref 39.0–52.0)
HEMOGLOBIN: 17.3 g/dL — AB (ref 13.0–17.0)
MCH: 30.3 pg (ref 26.0–34.0)
MCHC: 33.1 g/dL (ref 30.0–36.0)
MCV: 91.6 fL (ref 78.0–100.0)
Platelets: 179 10*3/uL (ref 150–400)
RBC: 5.71 MIL/uL (ref 4.22–5.81)
RDW: 14.2 % (ref 11.5–15.5)
WBC: 6.7 10*3/uL (ref 4.0–10.5)

## 2018-01-19 LAB — SURGICAL PCR SCREEN
MRSA, PCR: NEGATIVE
Staphylococcus aureus: NEGATIVE

## 2018-01-19 NOTE — Progress Notes (Signed)
Cardiologist  Dr. Orpah Greek   Carlena Bjornstad.   Requested last OV ,ECHO and stress test from Dr. Ammie Ferrier office,

## 2018-01-22 NOTE — Progress Notes (Signed)
Anesthesia Chart Review:   Case:  [id number] Date/Time:  01/30/18 1235   Procedure:  UNICOMPARTMENTAL KNEE (Right )   Anesthesia type:  Choice   Pre-op diagnosis:  djd right knee   Location:  [facility] OR ROOM 04 / [place] OR   Surgeon:  Marchia Bond, MD      DISCUSSION: - Pt is a 67 year old male with hx HTN, dysrhythmia   - Has cardiac and medical clearance for surgery   VS: BP 121/61   Pulse 82   Temp 36.9 C   Resp 20   Ht 5' 11.5" (1.816 m)   Wt 247 lb 3.2 oz (112.1 kg)   SpO2 96%   BMI 34.00 kg/m    PROVIDERS: PCP is Sherrilee Gilles, DO who cleared pt for surgery Cardiologist is Orpah Greek, MD who cleared pt for surgery in comment on stress test 12/28/17   LABS: Labs reviewed: Acceptable for surgery. (all labs ordered are listed, but only abnormal results are displayed)  Labs Reviewed  GLUCOSE, CAPILLARY - Abnormal; Notable for the following components:      Result Value   Glucose-Capillary 109 (*)    All other components within normal limits  BASIC METABOLIC PANEL - Abnormal; Notable for the following components:   CO2 20 (*)    Glucose, Bld 107 (*)    All other components within normal limits  CBC - Abnormal; Notable for the following components:   Hemoglobin 17.3 (*)    HCT 52.3 (*)    All other components within normal limits  SURGICAL PCR SCREEN    EKG 12/20/17 (Dr. Ammie Ferrier office):  Sinus rhythm with occasional PVCs.  Possible inferior infarct, age undetermined   CV:  Nuclear stress test 12/28/17 (Dr. Ammie Ferrier office):  1. The perfusion study is normal. No perfusion defects seen on imaging 2. The pt experienced flushing 3. No stress induced arrhythmias.  4. Physiologic blood pressure response 5. No infusion induced ECG changes 6. SPECT nuclear imaging demonstrates no evidence of ischemia 7. No evidence of artifact 8. Normal LV function. Calculated EF 50%  Echo 12/27/17 (Dr. Ammie Ferrier office): 1. Normal LV systolic function. EF 55-60% 2. Normal RV  function 3. Mild LVH 4. Normal diastolic function 5. Cardiac chamber imaging demonstrates mild to moderate RV enlargement, mild to moderate RA enlargement, and mild to moderate LA atrial enlargement.  6. Valvular and doppler imaging demonstrates mild mitral regurgitation, and mild pulmonic insufficiency.  7. Normal estimated PA systolic pressure 8. No pericardial effusion 9. No other significant findings   Past Medical History:  Diagnosis Date  . Anemia   . Arthritis   . Dysrhythmia   . Essential hypertension 03/22/2016  . GERD (gastroesophageal reflux disease)    occasionally  OTC Tums  . History of kidney stones   . 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 COLONOSCOPY N/A 04/29/2016   Procedure: COLONOSCOPY;  Surgeon: Rogene Houston, MD;  Location: [facility] ENDO SUITE;  Service: Endoscopy;  Laterality: N/A;  . cyst removes     back and neck  . ESOPHAGOGASTRODUODENOSCOPY N/A 04/29/2016   Procedure: ESOPHAGOGASTRODUODENOSCOPY (EGD);  Surgeon: Rogene Houston, MD;  Location: [facility] ENDO SUITE;  Service: Endoscopy;  Laterality: N/A;  2:15  . kidney stone surgery with laser    . KNEE ARTHROSCOPY    . TONSILLECTOMY      MEDICATIONS: . cholecalciferol (VITAMIN  D) 1000 units tablet  . Cyanocobalamin (B-12) 2500 MCG TABS  . naproxen (NAPROSYN) 500 MG tablet  . valsartan-hydrochlorothiazide (DIOVAN-HCT) 80-12.5 MG tablet   No current facility-administered medications for this encounter.     If no changes, I anticipate pt can proceed with surgery as scheduled.   Willeen Cass, FNP-BC [facility]/Anesthesiology Phone: [phone] 01/24/2018 12:15 PM

## 2018-01-29 MED ORDER — TRANEXAMIC ACID 1000 MG/10ML IV SOLN
1000.0000 mg | INTRAVENOUS | Status: AC
  Administered 2018-01-30: 1000 mg via INTRAVENOUS
  Filled 2018-01-29: qty 1100

## 2018-01-30 ENCOUNTER — Observation Stay (HOSPITAL_COMMUNITY): Payer: Medicare Other

## 2018-01-30 ENCOUNTER — Other Ambulatory Visit: Payer: Self-pay

## 2018-01-30 ENCOUNTER — Ambulatory Visit (HOSPITAL_COMMUNITY): Payer: Medicare Other | Admitting: Anesthesiology

## 2018-01-30 ENCOUNTER — Observation Stay (HOSPITAL_COMMUNITY)
Admission: RE | Admit: 2018-01-30 | Discharge: 2018-01-31 | Disposition: A | Discharge status: Home / Self Care | Payer: Medicare Other | Source: Ambulatory Visit | Attending: Orthopedic Surgery | Admitting: Orthopedic Surgery

## 2018-01-30 ENCOUNTER — Ambulatory Visit (HOSPITAL_COMMUNITY): Payer: Medicare Other | Admitting: Vascular Surgery

## 2018-01-30 ENCOUNTER — Encounter (HOSPITAL_COMMUNITY)
Admission: RE | Disposition: A | Discharge status: Home / Self Care | Payer: Self-pay | Source: Ambulatory Visit | Attending: Orthopedic Surgery

## 2018-01-30 ENCOUNTER — Encounter (HOSPITAL_COMMUNITY): Payer: Self-pay | Admitting: Anesthesiology

## 2018-01-30 DIAGNOSIS — Z820 Family history of epilepsy and other diseases of the nervous system: Secondary | ICD-10-CM | POA: Diagnosis not present

## 2018-01-30 DIAGNOSIS — Z8052 Family history of malignant neoplasm of bladder: Secondary | ICD-10-CM | POA: Diagnosis not present

## 2018-01-30 DIAGNOSIS — Z880 Allergy status to penicillin: Secondary | ICD-10-CM | POA: Diagnosis not present

## 2018-01-30 DIAGNOSIS — M1711 Unilateral primary osteoarthritis, right knee: Secondary | ICD-10-CM | POA: Diagnosis not present

## 2018-01-30 DIAGNOSIS — Z803 Family history of malignant neoplasm of breast: Secondary | ICD-10-CM | POA: Insufficient documentation

## 2018-01-30 DIAGNOSIS — Z825 Family history of asthma and other chronic lower respiratory diseases: Secondary | ICD-10-CM | POA: Diagnosis not present

## 2018-01-30 DIAGNOSIS — D649 Anemia, unspecified: Secondary | ICD-10-CM | POA: Insufficient documentation

## 2018-01-30 DIAGNOSIS — Z87442 Personal history of urinary calculi: Secondary | ICD-10-CM | POA: Insufficient documentation

## 2018-01-30 DIAGNOSIS — Z6833 Body mass index (BMI) 33.0-33.9, adult: Secondary | ICD-10-CM | POA: Diagnosis not present

## 2018-01-30 DIAGNOSIS — Z8619 Personal history of other infectious and parasitic diseases: Secondary | ICD-10-CM | POA: Insufficient documentation

## 2018-01-30 DIAGNOSIS — I499 Cardiac arrhythmia, unspecified: Secondary | ICD-10-CM | POA: Insufficient documentation

## 2018-01-30 DIAGNOSIS — Z79899 Other long term (current) drug therapy: Secondary | ICD-10-CM | POA: Diagnosis not present

## 2018-01-30 DIAGNOSIS — Z96659 Presence of unspecified artificial knee joint: Secondary | ICD-10-CM

## 2018-01-30 DIAGNOSIS — K219 Gastro-esophageal reflux disease without esophagitis: Secondary | ICD-10-CM | POA: Insufficient documentation

## 2018-01-30 DIAGNOSIS — Z96651 Presence of right artificial knee joint: Secondary | ICD-10-CM

## 2018-01-30 HISTORY — DX: Unilateral primary osteoarthritis, right knee: M17.11

## 2018-01-30 HISTORY — PX: PARTIAL KNEE ARTHROPLASTY: SHX2174

## 2018-01-30 SURGERY — ARTHROPLASTY, KNEE, UNICOMPARTMENTAL
Anesthesia: Spinal | Laterality: Right

## 2018-01-30 MED ORDER — MEPERIDINE HCL 50 MG/ML IJ SOLN: 6.2500 mg | INTRAMUSCULAR | Status: DC | PRN

## 2018-01-30 MED ORDER — HYDROMORPHONE HCL 1 MG/ML IJ SOLN
0.2500 mg | INTRAMUSCULAR | Status: DC | PRN
Start: 2018-01-30 — End: 2018-01-30

## 2018-01-30 MED ORDER — ONDANSETRON HCL 4 MG/2ML IJ SOLN
INTRAMUSCULAR | Status: DC | PRN
  Administered 2018-01-30: 4 mg via INTRAVENOUS

## 2018-01-30 MED ORDER — ROPIVACAINE HCL 7.5 MG/ML IJ SOLN
INTRAMUSCULAR | Status: DC | PRN
  Administered 2018-01-30: 20 mL via PERINEURAL

## 2018-01-30 MED ORDER — PROMETHAZINE HCL 25 MG/ML IJ SOLN: 6.2500 mg | INTRAMUSCULAR | Status: DC | PRN

## 2018-01-30 MED ORDER — SODIUM CHLORIDE 0.9 % IR SOLN
Status: DC | PRN
  Administered 2018-01-30: 1000 mL

## 2018-01-30 MED ORDER — MORPHINE SULFATE (PF) 2 MG/ML IV SOLN: 0.5000 mg | INTRAVENOUS | Status: DC | PRN

## 2018-01-30 MED ORDER — KETOROLAC TROMETHAMINE 15 MG/ML IJ SOLN
7.5000 mg | Freq: Four times a day (QID) | INTRAMUSCULAR | Status: DC
  Administered 2018-01-30 – 2018-01-31 (×3): 7.5 mg via INTRAVENOUS
  Filled 2018-01-30 (×3): qty 1

## 2018-01-30 MED ORDER — MIDAZOLAM HCL 2 MG/2ML IJ SOLN
2.0000 mg | Freq: Once | INTRAMUSCULAR | Status: AC
  Administered 2018-01-30: 2 mg via INTRAVENOUS

## 2018-01-30 MED ORDER — KETOROLAC TROMETHAMINE 30 MG/ML IJ SOLN
INTRAMUSCULAR | Status: DC | PRN
  Administered 2018-01-30: 30 mg via INTRAVENOUS

## 2018-01-30 MED ORDER — PROPOFOL 10 MG/ML IV BOLUS
INTRAVENOUS | Status: AC
  Filled 2018-01-30: qty 20

## 2018-01-30 MED ORDER — VALSARTAN-HYDROCHLOROTHIAZIDE 80-12.5 MG PO TABS: 1.0000 | ORAL_TABLET | Freq: Every day | ORAL | Status: DC

## 2018-01-30 MED ORDER — ZOLPIDEM TARTRATE 5 MG PO TABS: 5.0000 mg | ORAL_TABLET | Freq: Every evening | ORAL | Status: DC | PRN

## 2018-01-30 MED ORDER — BISACODYL 10 MG RE SUPP: 10.0000 mg | Freq: Every day | RECTAL | Status: DC | PRN

## 2018-01-30 MED ORDER — ASPIRIN EC 325 MG PO TBEC: 325.0000 mg | DELAYED_RELEASE_TABLET | Freq: Every day | ORAL | 0 refills | Status: DC

## 2018-01-30 MED ORDER — TRANEXAMIC ACID 1000 MG/10ML IV SOLN
1000.0000 mg | Freq: Once | INTRAVENOUS | Status: AC
  Administered 2018-01-30: 1000 mg via INTRAVENOUS
  Filled 2018-01-30: qty 10

## 2018-01-30 MED ORDER — GABAPENTIN 300 MG PO CAPS
ORAL_CAPSULE | ORAL | Status: AC
  Administered 2018-01-30: 300 mg via ORAL
  Filled 2018-01-30: qty 1

## 2018-01-30 MED ORDER — METHOCARBAMOL 1000 MG/10ML IJ SOLN
500.0000 mg | Freq: Four times a day (QID) | INTRAMUSCULAR | Status: DC | PRN
  Filled 2018-01-30: qty 5

## 2018-01-30 MED ORDER — MIDAZOLAM HCL 2 MG/2ML IJ SOLN
INTRAMUSCULAR | Status: AC
  Administered 2018-01-30: 2 mg via INTRAVENOUS
  Filled 2018-01-30: qty 2

## 2018-01-30 MED ORDER — VITAMIN D 1000 UNITS PO TABS
1000.0000 [IU] | ORAL_TABLET | Freq: Every day | ORAL | Status: DC
  Administered 2018-01-31: 1000 [IU] via ORAL
  Filled 2018-01-30: qty 1

## 2018-01-30 MED ORDER — VITAMIN B-12 1000 MCG PO TABS
2500.0000 ug | ORAL_TABLET | Freq: Every day | ORAL | Status: DC
  Administered 2018-01-31: 2500 ug via ORAL
  Filled 2018-01-30: qty 3

## 2018-01-30 MED ORDER — ACETAMINOPHEN 500 MG PO TABS
ORAL_TABLET | ORAL | Status: AC
  Administered 2018-01-30: 1000 mg via ORAL
  Filled 2018-01-30: qty 2

## 2018-01-30 MED ORDER — POTASSIUM CHLORIDE IN NACL 20-0.45 MEQ/L-% IV SOLN
INTRAVENOUS | Status: DC
  Administered 2018-01-30: 19:00:00 via INTRAVENOUS
  Filled 2018-01-30: qty 1000

## 2018-01-30 MED ORDER — ACETAMINOPHEN 500 MG PO TABS: 500.0000 mg | ORAL_TABLET | Freq: Four times a day (QID) | ORAL | Status: DC

## 2018-01-30 MED ORDER — PROPOFOL 500 MG/50ML IV EMUL
INTRAVENOUS | Status: DC | PRN
  Administered 2018-01-30: 75 ug/kg/min via INTRAVENOUS

## 2018-01-30 MED ORDER — METOCLOPRAMIDE HCL 5 MG PO TABS: 5.0000 mg | ORAL_TABLET | Freq: Three times a day (TID) | ORAL | Status: DC | PRN

## 2018-01-30 MED ORDER — SENNA-DOCUSATE SODIUM 8.6-50 MG PO TABS: 2.0000 | ORAL_TABLET | Freq: Every day | ORAL | 1 refills | Status: DC

## 2018-01-30 MED ORDER — CLOTRIMAZOLE 1 % EX CREA
TOPICAL_CREAM | Freq: Two times a day (BID) | CUTANEOUS | Status: DC
  Filled 2018-01-30: qty 15

## 2018-01-30 MED ORDER — ASPIRIN EC 325 MG PO TBEC
325.0000 mg | DELAYED_RELEASE_TABLET | Freq: Two times a day (BID) | ORAL | Status: DC
  Administered 2018-01-30 – 2018-01-31 (×2): 325 mg via ORAL
  Filled 2018-01-30 (×2): qty 1

## 2018-01-30 MED ORDER — PHENOL 1.4 % MT LIQD: 1.0000 | OROMUCOSAL | Status: DC | PRN

## 2018-01-30 MED ORDER — LACTATED RINGERS IV SOLN
INTRAVENOUS | Status: DC
  Administered 2018-01-30: 11:00:00 via INTRAVENOUS

## 2018-01-30 MED ORDER — BUPIVACAINE IN DEXTROSE 0.75-8.25 % IT SOLN
INTRATHECAL | Status: DC | PRN
  Administered 2018-01-30: 15 mg via INTRATHECAL

## 2018-01-30 MED ORDER — DEXAMETHASONE SODIUM PHOSPHATE 10 MG/ML IJ SOLN
10.0000 mg | Freq: Once | INTRAMUSCULAR | Status: AC
  Administered 2018-01-31: 10 mg via INTRAVENOUS
  Filled 2018-01-30: qty 1

## 2018-01-30 MED ORDER — BUPIVACAINE HCL (PF) 0.25 % IJ SOLN
INTRAMUSCULAR | Status: AC
  Filled 2018-01-30: qty 30

## 2018-01-30 MED ORDER — METHOCARBAMOL 500 MG PO TABS
500.0000 mg | ORAL_TABLET | Freq: Four times a day (QID) | ORAL | Status: DC | PRN
  Administered 2018-01-30 – 2018-01-31 (×3): 500 mg via ORAL
  Filled 2018-01-30 (×3): qty 1

## 2018-01-30 MED ORDER — IRBESARTAN 150 MG PO TABS
75.0000 mg | ORAL_TABLET | Freq: Every day | ORAL | Status: DC
  Administered 2018-01-30 – 2018-01-31 (×2): 75 mg via ORAL
  Filled 2018-01-30 (×2): qty 1

## 2018-01-30 MED ORDER — ACETAMINOPHEN 500 MG PO TABS
1000.0000 mg | ORAL_TABLET | Freq: Once | ORAL | Status: AC
  Administered 2018-01-30: 1000 mg via ORAL

## 2018-01-30 MED ORDER — ONDANSETRON HCL 4 MG PO TABS
4.0000 mg | ORAL_TABLET | Freq: Four times a day (QID) | ORAL | Status: DC | PRN
Start: 2018-01-30 — End: 2018-01-31

## 2018-01-30 MED ORDER — FENTANYL CITRATE (PF) 250 MCG/5ML IJ SOLN
INTRAMUSCULAR | Status: AC
  Filled 2018-01-30: qty 5

## 2018-01-30 MED ORDER — KETOROLAC TROMETHAMINE 30 MG/ML IJ SOLN
INTRAMUSCULAR | Status: AC
  Filled 2018-01-30: qty 1

## 2018-01-30 MED ORDER — ACETAMINOPHEN 325 MG PO TABS: 325.0000 mg | ORAL_TABLET | Freq: Four times a day (QID) | ORAL | Status: DC | PRN

## 2018-01-30 MED ORDER — CEFAZOLIN SODIUM-DEXTROSE 2-4 GM/100ML-% IV SOLN
INTRAVENOUS | Status: AC
  Filled 2018-01-30: qty 100

## 2018-01-30 MED ORDER — 0.9 % SODIUM CHLORIDE (POUR BTL) OPTIME
TOPICAL | Status: DC | PRN
  Administered 2018-01-30: 1000 mL

## 2018-01-30 MED ORDER — FENTANYL CITRATE (PF) 100 MCG/2ML IJ SOLN
INTRAMUSCULAR | Status: DC | PRN
  Administered 2018-01-30: 50 ug via INTRAVENOUS

## 2018-01-30 MED ORDER — CEFAZOLIN SODIUM-DEXTROSE 2-4 GM/100ML-% IV SOLN
2.0000 g | INTRAVENOUS | Status: AC
  Administered 2018-01-30: 2 g via INTRAVENOUS

## 2018-01-30 MED ORDER — METOCLOPRAMIDE HCL 5 MG/ML IJ SOLN: 5.0000 mg | Freq: Three times a day (TID) | INTRAMUSCULAR | Status: DC | PRN

## 2018-01-30 MED ORDER — HYDROCODONE-ACETAMINOPHEN 10-325 MG PO TABS: 1.0000 | ORAL_TABLET | Freq: Four times a day (QID) | ORAL | 0 refills | Status: DC | PRN

## 2018-01-30 MED ORDER — DEXAMETHASONE SODIUM PHOSPHATE 10 MG/ML IJ SOLN
8.0000 mg | Freq: Once | INTRAMUSCULAR | Status: AC
  Administered 2018-01-30: 8 mg via INTRAVENOUS

## 2018-01-30 MED ORDER — BUPIVACAINE HCL (PF) 0.25 % IJ SOLN
INTRAMUSCULAR | Status: DC | PRN
  Administered 2018-01-30: 30 mL

## 2018-01-30 MED ORDER — LACTATED RINGERS IV SOLN
INTRAVENOUS | Status: DC | PRN
  Administered 2018-01-30 (×2): via INTRAVENOUS

## 2018-01-30 MED ORDER — MAGNESIUM CITRATE PO SOLN: 1.0000 | Freq: Once | ORAL | Status: DC | PRN

## 2018-01-30 MED ORDER — POLYETHYLENE GLYCOL 3350 17 G PO PACK: 17.0000 g | PACK | Freq: Every day | ORAL | Status: DC | PRN

## 2018-01-30 MED ORDER — CEFAZOLIN SODIUM-DEXTROSE 2-4 GM/100ML-% IV SOLN
2.0000 g | Freq: Four times a day (QID) | INTRAVENOUS | Status: AC
  Administered 2018-01-30 – 2018-01-31 (×2): 2 g via INTRAVENOUS
  Filled 2018-01-30 (×2): qty 100

## 2018-01-30 MED ORDER — MENTHOL 3 MG MT LOZG: 1.0000 | LOZENGE | OROMUCOSAL | Status: DC | PRN

## 2018-01-30 MED ORDER — HYDROCODONE-ACETAMINOPHEN 5-325 MG PO TABS: 1.0000 | ORAL_TABLET | ORAL | Status: DC | PRN

## 2018-01-30 MED ORDER — ALUM & MAG HYDROXIDE-SIMETH 200-200-20 MG/5ML PO SUSP: 30.0000 mL | ORAL | Status: DC | PRN

## 2018-01-30 MED ORDER — SODIUM CHLORIDE 0.9 % IV SOLN
INTRAVENOUS | Status: DC | PRN
  Administered 2018-01-30: 50 ug/min via INTRAVENOUS

## 2018-01-30 MED ORDER — BACLOFEN 10 MG PO TABS: 10.0000 mg | ORAL_TABLET | Freq: Three times a day (TID) | ORAL | 0 refills | Status: DC

## 2018-01-30 MED ORDER — HYDROCODONE-ACETAMINOPHEN 7.5-325 MG PO TABS
1.0000 | ORAL_TABLET | ORAL | Status: DC | PRN
  Administered 2018-01-30 (×2): 1 via ORAL
  Administered 2018-01-31 (×3): 2 via ORAL
  Filled 2018-01-30 (×3): qty 2
  Filled 2018-01-30: qty 1
  Filled 2018-01-30: qty 2

## 2018-01-30 MED ORDER — MIDAZOLAM HCL 2 MG/2ML IJ SOLN: 0.5000 mg | Freq: Once | INTRAMUSCULAR | Status: DC | PRN

## 2018-01-30 MED ORDER — FENTANYL CITRATE (PF) 100 MCG/2ML IJ SOLN
INTRAMUSCULAR | Status: AC
  Administered 2018-01-30: 100 ug via INTRAVENOUS
  Filled 2018-01-30: qty 2

## 2018-01-30 MED ORDER — FENTANYL CITRATE (PF) 100 MCG/2ML IJ SOLN
100.0000 ug | Freq: Once | INTRAMUSCULAR | Status: AC
  Administered 2018-01-30: 100 ug via INTRAVENOUS

## 2018-01-30 MED ORDER — DEXAMETHASONE SODIUM PHOSPHATE 10 MG/ML IJ SOLN
INTRAMUSCULAR | Status: AC
  Administered 2018-01-30: 8 mg via INTRAVENOUS
  Filled 2018-01-30: qty 1

## 2018-01-30 MED ORDER — ONDANSETRON HCL 4 MG/2ML IJ SOLN: 4.0000 mg | Freq: Four times a day (QID) | INTRAMUSCULAR | Status: DC | PRN

## 2018-01-30 MED ORDER — ONDANSETRON HCL 4 MG PO TABS: 4.0000 mg | ORAL_TABLET | Freq: Three times a day (TID) | ORAL | 0 refills | Status: DC | PRN

## 2018-01-30 MED ORDER — DIPHENHYDRAMINE HCL 12.5 MG/5ML PO ELIX: 12.5000 mg | ORAL_SOLUTION | ORAL | Status: DC | PRN

## 2018-01-30 MED ORDER — DOCUSATE SODIUM 100 MG PO CAPS
100.0000 mg | ORAL_CAPSULE | Freq: Two times a day (BID) | ORAL | Status: DC
  Administered 2018-01-30 – 2018-01-31 (×2): 100 mg via ORAL
  Filled 2018-01-30 (×2): qty 1

## 2018-01-30 MED ORDER — GABAPENTIN 300 MG PO CAPS
300.0000 mg | ORAL_CAPSULE | Freq: Once | ORAL | Status: AC
  Administered 2018-01-30: 300 mg via ORAL

## 2018-01-30 MED ORDER — HYDROCHLOROTHIAZIDE 12.5 MG PO CAPS
12.5000 mg | ORAL_CAPSULE | Freq: Every day | ORAL | Status: DC
  Administered 2018-01-30 – 2018-01-31 (×2): 12.5 mg via ORAL
  Filled 2018-01-30 (×2): qty 1

## 2018-01-30 MED ORDER — PROPOFOL 10 MG/ML IV BOLUS
INTRAVENOUS | Status: DC | PRN
  Administered 2018-01-30: 20 mg via INTRAVENOUS

## 2018-01-30 SURGICAL SUPPLY — 53 items
BANDAGE ELASTIC 6 VELCRO ST LF (GAUZE/BANDAGES/DRESSINGS) ×3 IMPLANT
BANDAGE ESMARK 6X9 LF (GAUZE/BANDAGES/DRESSINGS) ×1 IMPLANT
BEARING TIBIAL STRL SZ3 LG 3 (Knees) ×2 IMPLANT
BEARING TIBIAL STRL SZ3 LG 3MM (Knees) ×1 IMPLANT
BNDG ELASTIC 6X15 VLCR STRL LF (GAUZE/BANDAGES/DRESSINGS) ×3 IMPLANT
BNDG ESMARK 6X9 LF (GAUZE/BANDAGES/DRESSINGS) ×3
BOWL SMART MIX CTS (DISPOSABLE) ×3 IMPLANT
CEMENT BONE R 1X40 (Cement) ×3 IMPLANT
CLOSURE STERI-STRIP 1/2X4 (GAUZE/BANDAGES/DRESSINGS) ×1
CLOSURE WOUND 1/2 X4 (GAUZE/BANDAGES/DRESSINGS) ×1
CLSR STERI-STRIP ANTIMIC 1/2X4 (GAUZE/BANDAGES/DRESSINGS) ×2 IMPLANT
COVER SURGICAL LIGHT HANDLE (MISCELLANEOUS) ×3 IMPLANT
CUFF TOURNIQUET SINGLE 34IN LL (TOURNIQUET CUFF) ×3 IMPLANT
DRAPE EXTREMITY T 121X128X90 (DRAPE) ×3 IMPLANT
DRAPE HALF SHEET 40X57 (DRAPES) ×3 IMPLANT
DRAPE U-SHAPE 47X51 STRL (DRAPES) ×3 IMPLANT
DRSG MEPILEX BORDER 4X8 (GAUZE/BANDAGES/DRESSINGS) ×3 IMPLANT
DURAPREP 26ML APPLICATOR (WOUND CARE) ×6 IMPLANT
ELECT CAUTERY BLADE 6.4 (BLADE) ×3 IMPLANT
ELECT REM PT RETURN 9FT ADLT (ELECTROSURGICAL) ×3
ELECTRODE REM PT RTRN 9FT ADLT (ELECTROSURGICAL) ×1 IMPLANT
GLOVE BIOGEL PI ORTHO PRO SZ8 (GLOVE) ×4
GLOVE ORTHO TXT STRL SZ7.5 (GLOVE) ×3 IMPLANT
GLOVE PI ORTHO PRO STRL SZ8 (GLOVE) ×2 IMPLANT
GLOVE SURG ORTHO 8.0 STRL STRW (GLOVE) ×3 IMPLANT
GOWN STRL REUS W/ TWL XL LVL3 (GOWN DISPOSABLE) ×1 IMPLANT
GOWN STRL REUS W/TWL 2XL LVL3 (GOWN DISPOSABLE) ×3 IMPLANT
GOWN STRL REUS W/TWL XL LVL3 (GOWN DISPOSABLE) ×2
HANDPIECE INTERPULSE COAX TIP (DISPOSABLE) ×2
HOOD PEEL AWAY FACE SHEILD DIS (HOOD) ×3 IMPLANT
HOOD PEEL AWAY FLYTE STAYCOOL (MISCELLANEOUS) ×3 IMPLANT
IMMOBILIZER KNEE 22 UNIV (SOFTGOODS) ×3 IMPLANT
KIT BASIN OR (CUSTOM PROCEDURE TRAY) ×3 IMPLANT
KIT TURNOVER KIT B (KITS) ×3 IMPLANT
MANIFOLD NEPTUNE II (INSTRUMENTS) ×3 IMPLANT
NEEDLE HYPO 21X1.5 SAFETY (NEEDLE) IMPLANT
NS IRRIG 1000ML POUR BTL (IV SOLUTION) ×3 IMPLANT
PACK BLADE SAW RECIP 70 3 PT (BLADE) ×3 IMPLANT
PACK TOTAL JOINT (CUSTOM PROCEDURE TRAY) ×3 IMPLANT
PAD ARMBOARD 7.5X6 YLW CONV (MISCELLANEOUS) ×6 IMPLANT
PEG FEMORAL CEMENT STRL LRG (Knees) ×3 IMPLANT
SET HNDPC FAN SPRY TIP SCT (DISPOSABLE) ×1 IMPLANT
STRIP CLOSURE SKIN 1/2X4 (GAUZE/BANDAGES/DRESSINGS) ×2 IMPLANT
SUCTION FRAZIER HANDLE 10FR (MISCELLANEOUS) ×2
SUCTION TUBE FRAZIER 10FR DISP (MISCELLANEOUS) ×1 IMPLANT
SUT VIC AB 0 CT1 27 (SUTURE) ×2
SUT VIC AB 0 CT1 27XBRD ANBCTR (SUTURE) ×1 IMPLANT
SUT VIC AB 1 CT1 27 (SUTURE) ×2
SUT VIC AB 1 CT1 27XBRD ANBCTR (SUTURE) ×1 IMPLANT
SUT VIC AB 3-0 SH 8-18 (SUTURE) ×3 IMPLANT
SYR CONTROL 10ML LL (SYRINGE) ×3 IMPLANT
TOWEL OR 17X26 10 PK STRL BLUE (TOWEL DISPOSABLE) ×3 IMPLANT
TRAY TIBIAL STERILE SZ E RT (Joint) ×3 IMPLANT

## 2018-01-30 NOTE — Transfer of Care (Signed)
Immediate Anesthesia Transfer of Care Note  Patient: Jack Elliott  Procedure(s) Performed: UNICOMPARTMENTAL KNEE (Right )  Patient Location: PACU  Anesthesia Type:MAC  Level of Consciousness: awake, alert  and oriented  Airway & Oxygen Therapy: Patient Spontanous Breathing  Post-op Assessment: Report given to RN and Post -op Vital signs reviewed and stable  Post vital signs: Reviewed and stable  Last Vitals:  Vitals Value Taken Time  BP 114/80 01/30/2018  2:46 PM  Temp 36.3 C 01/30/2018  2:46 PM  Pulse 91 01/30/2018  2:46 PM  Resp 20 01/30/2018  2:46 PM  SpO2 97 % 01/30/2018  2:46 PM  Vitals shown include unvalidated device data.  Last Pain:  Vitals:   01/30/18 1117  TempSrc: Oral         Complications: No apparent anesthesia complications

## 2018-01-30 NOTE — Anesthesia Procedure Notes (Signed)
Spinal  Patient location during procedure: OR End time: 01/30/2018 12:34 PM Staffing Anesthesiologist: Annye Asa, MD Performed: anesthesiologist  Preanesthetic Checklist Completed: patient identified, site marked, surgical consent, pre-op evaluation, timeout performed, IV checked, risks and benefits discussed and monitors and equipment checked Spinal Block Patient position: sitting Prep: site prepped and draped and DuraPrep Patient monitoring: blood pressure, continuous pulse ox, cardiac monitor and heart rate Approach: midline Location: L3-4 Injection technique: single-shot Needle Needle type: Pencan  Needle gauge: 24 G Needle length: 9 cm Additional Notes Pt identified in Operating room.  Monitors applied. Working IV access confirmed. Sterile prep, drape lumbar spine.  1% lido local L 3,4.  #24ga Pencan into clear CSF L 3,4.  15mg  0.75% Bupivacaine with dextrose injected with asp CSF beginning and end of injection.  Patient asymptomatic, VSS, no heme aspirated, tolerated well.  Jenita Seashore, MD

## 2018-01-30 NOTE — Evaluation (Signed)
Physical Therapy Evaluation Patient Details Name: Jack Elliott MRN: [id number] DOB: 1950/08/27 Today's Date: 01/30/2018   History of Present Illness  67 y.o. male admitted on 01/30/18 for elective R unicompartmental knee arthroplasty.  Pt with significant PMH of h/o [place] fever with scar tissue on his lungs, essential HTN, dysrhythmia, and anemia.  Clinical Impression  Pt is POD #0 and was able to get OOB to chair with RW and minimal assist.  His legs were still a bit shaky due to spinal and nerve block, so I did not press for gait this PM.  Education started and pt eager to move more tomorrow.  I anticipate he will progress well as long as his pain is manageable as the nerve block wears off.   PT to follow acutely for deficits listed below.       Follow Up Recommendations Follow surgeon's recommendation for DC plan and follow-up therapies    Equipment Recommendations  Rolling walker with 5" wheels    Recommendations for Other Services   NA    Precautions / Restrictions Precautions Precautions: Knee Precaution Booklet Issued: Yes (comment) Precaution Comments: knee exercise handout given, WB status and no pillow under surgical knee reviewed.  Required Braces or Orthoses: Knee Immobilizer - Right Restrictions Weight Bearing Restrictions: Yes RLE Weight Bearing: Weight bearing as tolerated      Mobility  Bed Mobility Overal bed mobility: Needs Assistance Bed Mobility: Supine to Sit     Supine to sit: Supervision     General bed mobility comments: supervision for safety due to fast speed of movement, pt did not need help with his LEs.   Transfers Overall transfer level: Needs assistance Equipment used: Rolling walker (2 wheeled) Transfers: Sit to/from Stand Sit to Stand: Min assist;From elevated surface         General transfer comment: Min assist to support trunk and stabilize RW during transitions from elevated bed.  Verbal cues for safe hand  placement.   Ambulation/Gait Ambulation/Gait assistance: Min guard Gait Distance (Feet): 5 Feet Assistive device: Rolling walker (2 wheeled) Gait Pattern/deviations: Step-to pattern;Antalgic     General Gait Details: Pt with mildly antalgic gait pattern, verbal cues for safe LE sequencing, he did not look very stable on his feet (some signs of buckling and given that he so recently had surgery I did not want to push him far this PM until I was sure that both the spinal an nerve block had worn off enough).           Balance Overall balance assessment: Needs assistance Sitting-balance support: Feet supported;No upper extremity supported Sitting balance-Leahy Scale: Good     Standing balance support: Bilateral upper extremity supported Standing balance-Leahy Scale: Poor                               Pertinent Vitals/Pain Pain Assessment: Faces Faces Pain Scale: Hurts little more Pain Location: right knee Pain Descriptors / Indicators: Grimacing;Guarding Pain Intervention(s): Limited activity within patient's tolerance;Monitored during session;Repositioned;Patient requesting pain meds-RN notified    Home Living Family/patient expects to be discharged to:: Private residence Living Arrangements: Spouse/significant other Available Help at Discharge: Family;Available 24 hours/day Type of Home: House Home Access: Stairs to enter Entrance Stairs-Rails: None Entrance Stairs-Number of Steps: 2(garage) Home Layout: Two level Home Equipment: Cane - single point      Prior Function Level of Independence: Independent with assistive device(s)  Comments: Pt reports he was using the cane at home and in the community.  He is retired, but enjoys yard work and his bicycle rides with friends.      Hand Dominance   Dominant Hand: Right    Extremity/Trunk Assessment   Upper Extremity Assessment Upper Extremity Assessment: Overall WFL for tasks assessed     Lower Extremity Assessment Lower Extremity Assessment: RLE deficits/detail RLE Deficits / Details: right leg with normal post op pain and weakness, ankle at least 3/5, knee 2+/5, hip flexion 3-/5 RLE Sensation: WNL(intact to LT, did not feel any residual numbness )    Cervical / Trunk Assessment Cervical / Trunk Assessment: Normal  Communication   Communication: No difficulties  Cognition Arousal/Alertness: Awake/alert Behavior During Therapy: WFL for tasks assessed/performed Overall Cognitive Status: Within Functional Limits for tasks assessed                                           Exercises Total Joint Exercises Ankle Circles/Pumps: AROM;Both;20 reps   Assessment/Plan    PT Assessment Patient needs continued PT services  PT Problem List Decreased strength;Decreased range of motion;Decreased activity tolerance;Decreased balance;Decreased mobility;Decreased knowledge of use of DME;Decreased knowledge of precautions;Pain       PT Treatment Interventions DME instruction;Gait training;Stair training;Functional mobility training;Therapeutic activities;Therapeutic exercise;Balance training;Patient/family education;Manual techniques;Modalities    PT Goals (Current goals can be found in the Care Plan section)  Acute Rehab PT Goals Patient Stated Goal: to go home tomorrow if he can and get back to being more acitve without pain and his knee poping out of place.  PT Goal Formulation: With patient Time For Goal Achievement: 02/06/18 Potential to Achieve Goals: Good    Frequency 7X/week           AM-PAC PT "6 Clicks" Daily Activity  Outcome Measure Difficulty turning over in bed (including adjusting bedclothes, sheets and blankets)?: A Little Difficulty moving from lying on back to sitting on the side of the bed? : A Little Difficulty sitting down on and standing up from a chair with arms (e.g., wheelchair, bedside commode, etc,.)?: Unable Help needed  moving to and from a bed to chair (including a wheelchair)?: A Little Help needed walking in hospital room?: A Little Help needed climbing 3-5 steps with a railing? : A Little 6 Click Score: 16    End of Session Equipment Utilized During Treatment: Right knee immobilizer Activity Tolerance: Patient limited by pain Patient left: in chair;with call bell/phone within reach;with family/visitor present Nurse Communication: Mobility status;Patient requests pain meds PT Visit Diagnosis: Muscle weakness (generalized) (M62.81);Difficulty in walking, not elsewhere classified (R26.2);Pain Pain - Right/Left: Right Pain - part of body: Knee    Time: 3748-2707 PT Time Calculation (min) (ACUTE ONLY): 24 min   Charges:          Wells Guiles B. Mikesha Migliaccio, PT, DPT [phone]   PT Evaluation $PT Eval Moderate Complexity: 1 Mod PT Treatments $Therapeutic Activity: 8-22 mins   01/30/2018, 5:22 PM

## 2018-01-30 NOTE — Discharge Instructions (Signed)

## 2018-01-30 NOTE — Op Note (Signed)
01/30/2018  2:19 PM  PATIENT:  Jack Elliott    PRE-OPERATIVE DIAGNOSIS: Right knee primary localized osteoarthritis  POST-OPERATIVE DIAGNOSIS:  Same  PROCEDURE:  Unicompartmental Knee Arthroplasty  SURGEON:  Johnny Bridge, MD  PHYSICIAN ASSISTANT: Joya Gaskins, OPA-C, present and scrubbed throughout the case, critical for completion in a timely fashion, and for retraction, instrumentation, and closure.  ANESTHESIA:   Spinal with adductor canal block with intra-articular injection  ESTIMATED BLOOD LOSS: 75 mL  UNIQUE ASPECTS OF THE CASE: The knee was extremely tight, and had a significant lack of flexion on preoperative examination.  I was able to restore flexion by removing the posterior osteophytes.  The 3 polyethylene was slightly tight, but did provide adequate restoration of articulation, with no impingement.  I considered making a second tibial cut, however ultimately the 3 felt appropriate.  There was substantial osteophyte formation on both the patella as well as the medial femur, as well as posteriorly, which were all removed.  The patella had excellent cartilage, the femoral trochlea had extensive grade 2 changes, the medial femoral condyle had eburnation with severe chondral loss.  The lateral compartment was intact, and the ACL was intact.  The femur sized actually to an extra-large, but this was osteophyte posteriorly, which was removed.  PREOPERATIVE INDICATIONS:  Jack Elliott is a  67 y.o. male with a diagnosis of djd right knee who failed conservative measures and elected for surgical management.    The risks benefits and alternatives were discussed with the patient preoperatively including but not limited to the risks of infection, bleeding, nerve injury, cardiopulmonary complications, blood clots, the need for revision surgery, among others, and the patient was willing to proceed.  OPERATIVE IMPLANTS: [employer] [place] mobile bearing medial compartment arthroplasty  femur size large, tibia size E, bearing size 3.  OPERATIVE FINDINGS: As above.  OPERATIVE PROCEDURE: The patient was brought to the operating room placed in the supine position.  Spinal anesthesia was administered.  Foley was placed.  IV antibiotics were given. The lower extremity was placed in the legholder and prepped and draped in usual sterile fashion.  Time out was performed.  The leg was elevated and exsanguinated and the tourniquet was inflated. Anteromedial incision was performed, and I took care to preserve the MCL. Parapatellar incision was carried out, and the osteophytes were excised, along with the medial meniscus and a small portion of the fat pad.  The extra medullary tibial cutting jig was applied, using the spoon and the 3mm G-Clamp and the 2 mm shim, and I took care to protect the anterior cruciate ligament insertion and the tibial spine. The medial collateral ligament was also protected, and I resected my proximal tibia, matching the anatomic slope.   The proximal tibial bony cut was removed although it broke into 2 pieces, and I had to complete the cut posteriorly and then remove the posterior piece that was connected to osteophyte with a curette.  I turned my attention to the femur.  The intramedullary femoral rod was placed using the drill, and then using the appropriate reference, I assembled the femoral jig, setting my posterior cutting block. I resected my posterior femur, used the 0 spigot for the anterior femur, and then measured my gap.   I then used the appropriate mill to match the extension gap to the flexion gap. The second milling was at a 3.  The gaps were then measured again with the appropriate feeler gauges. Once I had balanced flexion and  extension gaps, I then completed the preparation of the femur.  I milled off the anterior aspect of the distal femur to prevent impingement. I also exposed the tibia, and selected the above-named component, and then used the  cutting jig to prepare the keel slot on the tibia. I also used the awl to curette out the bone to complete the preparation of the keel. The back wall was intact.  I then placed trial components, and it was found to have excellent motion, and appropriate balance.  I then cemented the components into place, cementing the tibia first, removing all excess cement, and then cementing the femur.  All loose cement was removed.  The real polyethylene insert was applied manually, and the knee was taken through functional range of motion, and found to have excellent stability and restoration of joint motion, with excellent balance.  The wounds were irrigated copiously, and the parapatellar tissue closed with Vicryl, followed by Vicryl for the subcutaneous tissue, with routine closure with Steri-Strips and sterile gauze.  The tourniquet was released, and the patient was awakened and extubated and returned to PACU in stable and satisfactory condition. There were no complications.

## 2018-01-30 NOTE — H&P (Signed)
PREOPERATIVE H&P  Chief Complaint: Right knee pain  HPI: Jack Elliott is a 67 y.o. male who presents for preoperative history and physical with a diagnosis of right knee anteromedial osteoarthritis. Symptoms are rated as moderate to severe, and have been worsening.  This is significantly impairing activities of daily living.  He has elected for surgical management.   He has failed injections, activity modification, anti-inflammatories, and assistive devices.  Preoperative X-rays demonstrate end stage degenerative changes with osteophyte formation, loss of joint space, subchondral sclerosis.  He has had swelling around the knee, failed Naprosyn, had a GI bleed, and has had difficulty bearing weight since April.  Past Medical History:  Diagnosis Date  . Anemia   . Arthritis   . Dysrhythmia   . Essential hypertension 03/22/2016  . GERD (gastroesophageal reflux disease)    occasionally  OTC Tums  . History of kidney stones   . 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 COLONOSCOPY N/A 04/29/2016   Procedure: COLONOSCOPY;  Surgeon: Rogene Houston, MD;  Location: [facility] SUITE;  Service: Endoscopy;  Laterality: N/A;  . cyst removes     back and neck  . ESOPHAGOGASTRODUODENOSCOPY N/A 04/29/2016   Procedure: ESOPHAGOGASTRODUODENOSCOPY (EGD);  Surgeon: Rogene Houston, MD;  Location: [facility];  Service: Endoscopy;  Laterality: N/A;  2:15  . kidney stone surgery with laser    . KNEE ARTHROSCOPY    . TONSILLECTOMY     Social History   Socioeconomic History  . Marital status: Married    Spouse name: Not on file  . Number of children: Not on file  . Years of education: Not on file  . Highest education level: Not on file  Occupational History  . Not on file  Social Needs  . Financial resource strain: Not on file  . Food insecurity:    Worry: Not on file    Inability: Not on  file  . Transportation needs:    Medical: Not on file    Non-medical: Not on file  Tobacco Use  . Smoking status: Never Smoker  . Smokeless tobacco: Never Used  Substance and Sexual Activity  . Alcohol use: No  . Drug use: No  . Sexual activity: Not on file  Lifestyle  . Physical activity:    Days per week: Not on file    Minutes per session: Not on file  . Stress: Not on file  Relationships  . Social connections:    Talks on phone: Not on file    Gets together: Not on file    Attends religious service: Not on file    Active member of club or organization: Not on file    Attends meetings of clubs or organizations: Not on file    Relationship status: Not on file  Other Topics Concern  . Not on file  Social History Narrative  . Not on file   Family History  Problem Relation Age of Onset  . Multiple sclerosis Mother   . Breast cancer Mother   . Bladder Cancer Father   . Emphysema Father    Allergies  Allergen Reactions  . Penicillins Rash    Has patient had a PCN reaction causing immediate rash, facial/tongue/throat swelling, SOB or lightheadedness with hypotension: No Has patient had a PCN reaction causing severe rash involving mucus membranes or skin necrosis: No Has patient had a  PCN reaction that required hospitalization No Has patient had a PCN reaction occurring within the last 10 years: No Patient stated felt like spiders were crawling on him If all of the above answers are "NO", then may proceed with Cephalosporin use.    Prior to Admission medications   Medication Sig Start Date End Date Taking? Authorizing Provider  cholecalciferol (VITAMIN D) 1000 units tablet Take 1,000 Units by mouth daily.   Yes [provider]  clotrimazole-betamethasone (LOTRISONE) cream Apply 1 application topically 2 (two) times daily.   Yes [provider]  Cyanocobalamin (B-12) 2500 MCG TABS Take 2,500 mcg by mouth daily.   Yes [provider]  naproxen  (NAPROSYN) 500 MG tablet Take 500 mg by mouth daily as needed for moderate pain.  04/29/16  Yes Rehman, Mechele Dawley, MD  valsartan-hydrochlorothiazide (DIOVAN-HCT) 80-12.5 MG tablet Take 1 tablet by mouth daily.   Yes [provider]     Positive ROS: All other systems have been reviewed and were otherwise negative with the exception of those mentioned in the HPI and as above.  Physical Exam: General: Alert, no acute distress Cardiovascular: No pedal edema Respiratory: No cyanosis, no use of accessory musculature GI: No organomegaly, abdomen is soft and non-tender Skin: No lesions in the area of chief complaint Neurologic: Sensation intact distally Psychiatric: Patient is competent for consent with normal mood and affect Lymphatic: No axillary or cervical lymphadenopathy  MUSCULOSKELETAL: Right knee range of motion 0 to 120 degrees with varus alignment and pseudolaxity with pain to palpation and crepitance medially.  Assessment: Right anteromedial knee osteoarthritis   Plan: Plan for Procedure(s): UNICOMPARTMENTAL KNEE  The risks benefits and alternatives were discussed with the patient including but not limited to the risks of nonoperative treatment, versus surgical intervention including infection, bleeding, nerve injury,  blood clots, cardiopulmonary complications, morbidity, mortality, among others, and they were willing to proceed.    Patient's anticipated LOS is less than 2 midnights, meeting these requirements: - Younger than 66 - Lives within 1 hour of care - Has a competent adult at home to recover with post-op recover - NO history of  - Chronic pain requiring opiods  - Diabetes  - Coronary Artery Disease  - Heart failure  - Heart attack  - Stroke  - DVT/VTE  - Cardiac arrhythmia  - Respiratory Failure/COPD  - Renal failure  - Anemia  - Advanced Liver disease       Preoperative templating of the joint replacement has been completed, documented, and  submitted to the Operating Room personnel in order to optimize intra-operative equipment management.  Johnny Bridge, MD Cell [phone]   01/30/2018 11:39 AM

## 2018-01-30 NOTE — Progress Notes (Signed)
Pt arrived to room 5N03 via bed. Family at bedside. Received report from [place], [place] in PACU. See assessment. Will continue to monitor.

## 2018-01-30 NOTE — Anesthesia Procedure Notes (Signed)
Anesthesia Regional Block: Adductor canal block   Pre-Anesthetic Checklist: ,, timeout performed, Correct Patient, Correct Site, Correct Laterality, Correct Procedure, Correct Position, site marked, Risks and benefits discussed,  Surgical consent,  Pre-op evaluation,  At surgeon's request and post-op pain management  Laterality: Right and Lower  Prep: chloraprep       Needles:  Injection technique: Single-shot  Needle Type: Echogenic Needle     Needle Length: 9cm  Needle Gauge: 21     Additional Needles:   Procedures:,,,, ultrasound used (permanent image in chart),,,,  Narrative:  Start time: 01/30/2018 11:54 AM End time: 01/30/2018 12:01 PM Injection made incrementally with aspirations every 5 mL.  Performed by: Personally  Anesthesiologist: Annye Asa, MD  Additional Notes: Pt identified in Holding room.  Monitors applied. Working IV access confirmed. Sterile prep, drape R thigh.  #21ga ECHOgenic needle into adductor canal with US guidance.  20cc 0.75% Ropivacaine injected incrementally after negative test dose.  Patient asymptomatic, VSS, no heme aspirated, tolerated well.  Jenita Seashore, MD

## 2018-01-30 NOTE — Plan of Care (Signed)
  Problem: Education: Goal: Knowledge of General Education information will improve Description Including pain rating scale, medication(s)/side effects and non-pharmacologic comfort measures Outcome: Progressing   Problem: Health Behavior/Discharge Planning: Goal: Ability to manage health-related needs will improve Outcome: Progressing   Problem: Clinical Measurements: Goal: Ability to maintain clinical measurements within normal limits will improve Outcome: Progressing Goal: Will remain free from infection Outcome: Progressing Goal: Diagnostic test results will improve Outcome: Progressing Goal: Respiratory complications will improve Outcome: Progressing Goal: Cardiovascular complication will be avoided Outcome: Progressing   Problem: Activity: Goal: Risk for activity intolerance will decrease Outcome: Progressing   Problem: Nutrition: Goal: Adequate nutrition will be maintained Outcome: Progressing   Problem: Coping: Goal: Level of anxiety will decrease Outcome: Progressing   Problem: Elimination: Goal: Will not experience complications related to bowel motility Outcome: Progressing Goal: Will not experience complications related to urinary retention Outcome: Progressing   Problem: Pain Managment: Goal: General experience of comfort will improve Outcome: Progressing   Problem: Safety: Goal: Ability to remain free from injury will improve Outcome: Progressing   Problem: Skin Integrity: Goal: Risk for impaired skin integrity will decrease Outcome: Progressing   Problem: Education: Goal: Knowledge of the prescribed therapeutic regimen will improve Outcome: Progressing Goal: Individualized Educational Video(s) Outcome: Progressing   Problem: Pain Management: Goal: Pain level will decrease with appropriate interventions Outcome: Progressing

## 2018-01-30 NOTE — Plan of Care (Signed)
Problem: Education: Goal: Knowledge of General Education information will improve Description Including pain rating scale, medication(s)/side effects and non-pharmacologic comfort measures Outcome: Progressing   Problem: Nutrition: Goal: Adequate nutrition will be maintained Outcome: Progressing   Problem: Pain Managment: Goal: General experience of comfort will improve Outcome: Progressing   Problem: Education: Goal: Knowledge of the prescribed therapeutic regimen will improve Outcome: Progressing   Problem: Pain Management: Goal: Pain level will decrease with appropriate interventions Outcome: Progressing

## 2018-01-30 NOTE — Anesthesia Preprocedure Evaluation (Addendum)
Anesthesia Evaluation  Patient identified by MRN, date of birth, ID band Patient awake    Reviewed: Allergy & Precautions, NPO status , Patient's Chart, lab work & pertinent test results  History of Anesthesia Complications Negative for: history of anesthetic complications  Airway Mallampati: II  TM Distance: >3 FB Neck ROM: Full    Dental  (+) Caps, Dental Advisory Given   Pulmonary neg pulmonary ROS,    breath sounds clear to auscultation       Cardiovascular hypertension, Pt. on medications (-) angina Rhythm:Regular Rate:Normal     Neuro/Psych negative neurological ROS     GI/Hepatic Neg liver ROS, GERD  Controlled,  Endo/Other  Morbid obesity  Renal/GU negative Renal ROS     Musculoskeletal  (+) Arthritis ,   Abdominal (+) + obese,   Peds  Hematology negative hematology ROS (+)   Anesthesia Other Findings   Reproductive/Obstetrics                            Anesthesia Physical Anesthesia Plan  ASA: II  Anesthesia Plan: Spinal   Post-op Pain Management:  Regional for Post-op pain   Induction:   PONV Risk Score and Plan: 1 and Ondansetron and Treatment may vary due to age or medical condition  Airway Management Planned: Natural Airway and Simple Face Mask  Additional Equipment:   Intra-op Plan:   Post-operative Plan:   Informed Consent: I have reviewed the patients History and Physical, chart, labs and discussed the procedure including the risks, benefits and alternatives for the proposed anesthesia with the patient or authorized representative who has indicated his/her understanding and acceptance.   Dental advisory given  Plan Discussed with: CRNA and Surgeon  Anesthesia Plan Comments: (Plan routine monitors, SAB with adductor canal block for post op analgesia)        Anesthesia Quick Evaluation

## 2018-01-31 ENCOUNTER — Encounter (HOSPITAL_COMMUNITY): Payer: Self-pay | Admitting: Orthopedic Surgery

## 2018-01-31 DIAGNOSIS — M1711 Unilateral primary osteoarthritis, right knee: Secondary | ICD-10-CM | POA: Diagnosis not present

## 2018-01-31 LAB — BASIC METABOLIC PANEL
Anion gap: 10 (ref 5–15)
BUN: 14 mg/dL (ref 8–23)
CO2: 20 mmol/L — ABNORMAL LOW (ref 22–32)
Calcium: 8.5 mg/dL — ABNORMAL LOW (ref 8.9–10.3)
Chloride: 105 mmol/L (ref 98–111)
Creatinine, Ser: 0.78 mg/dL (ref 0.61–1.24)
GFR calc Af Amer: >60 mL/min (ref >60)
Glucose, Bld: 149 mg/dL — ABNORMAL HIGH (ref 70–99)
Potassium: 4.3 mmol/L (ref 3.5–5.1)
Sodium: 135 mmol/L (ref 135–145)

## 2018-01-31 LAB — CBC
HCT: 45.5 % (ref 39.0–52.0)
Hemoglobin: 15.1 g/dL (ref 13.0–17.0)
MCH: 30.4 pg (ref 26.0–34.0)
MCHC: 33.2 g/dL (ref 30.0–36.0)
MCV: 91.7 fL (ref 78.0–100.0)
PLATELETS: 164 10*3/uL (ref 150–400)
RBC: 4.96 MIL/uL (ref 4.22–5.81)
RDW: 14.2 % (ref 11.5–15.5)
WBC: 13.3 10*3/uL — ABNORMAL HIGH (ref 4.0–10.5)

## 2018-01-31 NOTE — Progress Notes (Signed)
Physical Therapy Treatment Patient Details Name: Jack Elliott MRN: [id number] DOB: 12-27-1950 Today's Date: 01/31/2018    History of Present Illness 67 y.o. male admitted on 01/30/18 for elective R unicompartmental knee arthroplasty.  Pt with significant PMH of h/o [place] fever with scar tissue on his lungs, essential HTN, dysrhythmia, and anemia.    PT Comments    Pt was able to complete gait, stairs, and review his entire HEP this AM.  He is ready for d/c home with his wife's assist.    Follow Up Recommendations  Follow surgeon's recommendation for DC plan and follow-up therapies     Equipment Recommendations  Rolling walker with 5" wheels    Recommendations for Other Services   NA     Precautions / Restrictions Precautions Precautions: Knee Precaution Booklet Issued: Yes (comment) Precaution Comments: knee exercise handout given, WB status and no pillow under surgical knee reviewed.  Required Braces or Orthoses: Knee Immobilizer - Right(d/c today) Restrictions RLE Weight Bearing: Weight bearing as tolerated    Mobility  Bed Mobility               General bed mobility comments: Pt was OOB in the recliner chair.   Transfers Overall transfer level: Needs assistance Equipment used: Rolling walker (2 wheeled) Transfers: Sit to/from Stand Sit to Stand: Supervision         General transfer comment: supervision for safety, verbal cues for safe hand placement.   Ambulation/Gait Ambulation/Gait assistance: Supervision Gait Distance (Feet): 130 Feet Assistive device: Rolling walker (2 wheeled) Gait Pattern/deviations: Step-through pattern;Antalgic     General Gait Details: Midly antalgic gait pattern with cues for heel to toe gait pattern,    Stairs Stairs: Yes Stairs assistance: Min guard Stair Management: One rail Left;Step to pattern;Forwards;No rails;With walker Number of Stairs: 3 General stair comments: 1 curb step simulating home entry  forward with RW, and two normal steps with left rail simulating stairs up to his bedroom. Min guard assit for safety, verbal cues for correct LE sequencing.           Balance Overall balance assessment: Needs assistance Sitting-balance support: Feet supported;No upper extremity supported Sitting balance-Leahy Scale: Good     Standing balance support: Bilateral upper extremity supported;No upper extremity supported Standing balance-Leahy Scale: Fair                              Cognition Arousal/Alertness: Awake/alert Behavior During Therapy: WFL for tasks assessed/performed Overall Cognitive Status: Within Functional Limits for tasks assessed                                        Exercises Total Joint Exercises Ankle Circles/Pumps: AROM;Both;20 reps Quad Sets: AROM;Right;10 reps Towel Squeeze: AROM;Both;10 reps Short Arc Quad: AROM;Right;10 reps Heel Slides: AAROM;Right;10 reps Hip ABduction/ADduction: AROM;Right;10 reps Straight Leg Raises: AROM;Right;10 reps Long Arc Quad: AROM;Right;10 reps Knee Flexion: AROM;AAROM;Right;10 reps Goniometric ROM: 8-95        Pertinent Vitals/Pain Pain Assessment: 0-10 Pain Score: 6  Pain Location: right knee Pain Descriptors / Indicators: Grimacing;Guarding Pain Intervention(s): Limited activity within patient's tolerance;Monitored during session;Repositioned;Ice applied           PT Goals (current goals can now be found in the care plan section) Acute Rehab PT Goals Patient Stated Goal: to go home today Progress towards PT  goals: Progressing toward goals    Frequency    7X/week      PT Plan Current plan remains appropriate       AM-PAC PT "6 Clicks" Daily Activity  Outcome Measure  Difficulty turning over in bed (including adjusting bedclothes, sheets and blankets)?: A Little Difficulty moving from lying on back to sitting on the side of the bed? : A Little Difficulty sitting down  on and standing up from a chair with arms (e.g., wheelchair, bedside commode, etc,.)?: None Help needed moving to and from a bed to chair (including a wheelchair)?: None Help needed walking in hospital room?: None Help needed climbing 3-5 steps with a railing? : A Little 6 Click Score: 21    End of Session   Activity Tolerance: Patient limited by pain Patient left: in chair;with call bell/phone within reach;with family/visitor present Nurse Communication: Mobility status PT Visit Diagnosis: Muscle weakness (generalized) (M62.81);Difficulty in walking, not elsewhere classified (R26.2);Pain Pain - Right/Left: Right Pain - part of body: Knee     Time: 0814-4818 PT Time Calculation (min) (ACUTE ONLY): 41 min  Charges:  $Gait Training: 23-37 mins $Therapeutic Exercise: 8-22 mins          Nakyla Bracco B. [place], [place], DPT [phone]            01/31/2018, 6:14 PM

## 2018-01-31 NOTE — Care Management Note (Signed)
Case Management Note  Patient Details  Name: Jack Elliott MRN: [id number] Date of Birth: [birth year]  Subjective/Objective:   67 yr old gentleman s/p right unicompartmental knee arthroplasty.                 Action/Plan: Case manager spoke with patient and his wife concerning discharge plan. Patient will go to outpatient therapy at [facility] in [place], [place]. Patient will have family support at discharge.     Expected Discharge Date:  01/31/18               Expected Discharge Plan:  OP Rehab  In-House Referral:  NA  Discharge planning Services  CM Consult  Post Acute Care Choice:  Durable Medical Equipment Choice offered to:  Patient  DME Arranged:  Gilford Rile rolling DME Agency:  [employer]  HH Arranged:  NA HH Agency:  NA  Status of Service:  Completed, signed off  If discussed at [employer] of Stay Meetings, dates discussed:    Additional Comments:  Ninfa Meeker, RN 01/31/2018, 10:38 AM

## 2018-01-31 NOTE — Discharge Summary (Signed)
Physician Discharge Summary  Patient ID: Jack Elliott MRN: [id number] DOB/AGE: 19-May-1951 67 y.o.  Admit date: 01/30/2018 Discharge date: 01/31/2018  Admission Diagnoses:  Primary localized osteoarthritis of right knee  Discharge Diagnoses:  Principal Problem:   Primary localized osteoarthritis of right knee Active Problems:   S/P knee replacement   Past Medical History:  Diagnosis Date  . Anemia   . Arthritis   . Dysrhythmia   . Essential hypertension 03/22/2016  . GERD (gastroesophageal reflux disease)    occasionally  OTC Tums  . History of kidney stones   . 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 Primary localized osteoarthritis of right knee 01/30/2018    Surgeries: Procedure(s): UNICOMPARTMENTAL KNEE on 01/30/2018   Consultants (if any):   Discharged Condition: Improved  Hospital Course: Jack Elliott is an 67 y.o. male who was admitted 01/30/2018 with a diagnosis of Primary localized osteoarthritis of right knee and went to the operating room on 01/30/2018 and underwent the above named procedures.    He was given perioperative antibiotics:  Anti-infectives (From admission, onward)   Start     Dose/Rate Route Frequency Ordered Stop   01/30/18 1800  ceFAZolin (ANCEF) IVPB 2g/100 mL premix     2 g 200 mL/hr over 30 Minutes Intravenous Every 6 hours 01/30/18 1614 01/31/18 0144   01/30/18 1115  ceFAZolin (ANCEF) IVPB 2g/100 mL premix    Note to Pharmacy:  His reaction was not even a rash, he just felt like he had spiders on his skin.  No problems breathing or ulcerations.  This was 40 years ago.   2 g 200 mL/hr over 30 Minutes Intravenous On call to O.R. 01/30/18 1109 01/30/18 1227   01/30/18 1114  ceFAZolin (ANCEF) 2-4 GM/100ML-% IVPB    Note to Pharmacy:  Henrine Screws   : cabinet override      01/30/18 1114 01/30/18 1227    .  He was given sequential compression devices, early ambulation, and aspirin for DVT  prophylaxis.  He benefited maximally from the hospital stay and there were no complications.    Recent vital signs:  Vitals:   01/31/18 0000 01/31/18 0435  BP: 120/65 (!) 102/56  Pulse: 100 70  Resp: 16 18  Temp: (!) 97.5 F (36.4 C) (!) 97.5 F (36.4 C)  SpO2: 94% 97%    Recent laboratory studies:  Lab Results  Component Value Date   HGB 15.1 01/31/2018   HGB 17.3 (H) 01/19/2018   Lab Results  Component Value Date   WBC 13.3 (H) 01/31/2018   PLT 164 01/31/2018   No results found for: INR Lab Results  Component Value Date   NA 135 01/31/2018   K 4.3 01/31/2018   CL 105 01/31/2018   CO2 20 (L) 01/31/2018   BUN 14 01/31/2018   CREATININE 0.78 01/31/2018   GLUCOSE 149 (H) 01/31/2018    Discharge Medications:   Allergies as of 01/31/2018      Reactions   Penicillins Rash   Has patient had a PCN reaction causing immediate rash, facial/tongue/throat swelling, SOB or lightheadedness with hypotension: No Has patient had a PCN reaction causing severe rash involving mucus membranes or skin necrosis: No Has patient had a PCN reaction that required hospitalization No Has patient had a PCN reaction occurring within the last 10 years: No Patient stated felt like spiders were crawling on him If all of the above answers are "NO", then  may proceed with Cephalosporin use.      Medication List    STOP taking these medications   naproxen 500 MG tablet Commonly known as:  NAPROSYN     TAKE these medications   aspirin EC 325 MG tablet Take 1 tablet (325 mg total) by mouth daily.   B-12 2500 MCG Tabs Take 2,500 mcg by mouth daily.   baclofen 10 MG tablet Commonly known as:  LIORESAL Take 1 tablet (10 mg total) by mouth 3 (three) times daily. As needed for muscle spasm   cholecalciferol 1000 units tablet Commonly known as:  VITAMIN D Take 1,000 Units by mouth daily.   clotrimazole-betamethasone cream Commonly known as:  LOTRISONE Apply 1 application topically 2 (two)  times daily.   HYDROcodone-acetaminophen 10-325 MG tablet Commonly known as:  NORCO Take 1 tablet by mouth every 6 (six) hours as needed.   ondansetron 4 MG tablet Commonly known as:  ZOFRAN Take 1 tablet (4 mg total) by mouth every 8 (eight) hours as needed for nausea or vomiting.   sennosides-docusate sodium 8.6-50 MG tablet Commonly known as:  SENOKOT-S Take 2 tablets by mouth daily.   valsartan-hydrochlorothiazide 80-12.5 MG tablet Commonly known as:  DIOVAN-HCT Take 1 tablet by mouth daily.       Diagnostic Studies: Dg Knee Right Port  Result Date: 01/30/2018 CLINICAL DATA:  67 year old male post right unicompartmental knee replacement. Initial encounter. EXAM: PORTABLE RIGHT KNEE - 1-2 VIEW COMPARISON:  None. FINDINGS: Post medial right knee hemiarthroplasty which appears in satisfactory position without complication noted. Patellofemoral joint degenerative changes. IMPRESSION: Post right medial unicompartmental knee replacement. Electronically Signed   By: Genia Del M.D.   On: 01/30/2018 15:58    Disposition:     Follow-up Information    Marchia Bond, MD. Schedule an appointment as soon as possible for a visit in 2 weeks.   Specialty:  Orthopedic Surgery Contact information: [address] [place] [address] [phone]            Signed: Johnny Bridge 01/31/2018, 8:38 AM

## 2018-01-31 NOTE — Anesthesia Postprocedure Evaluation (Signed)
Anesthesia Post Note  Patient: Jack Elliott  Procedure(s) Performed: UNICOMPARTMENTAL KNEE (Right )     Patient location during evaluation: PACU Anesthesia Type: Spinal and Regional Level of consciousness: awake and alert, oriented and patient cooperative Pain management: pain level controlled Vital Signs Assessment: post-procedure vital signs reviewed and stable Respiratory status: spontaneous breathing, nonlabored ventilation and respiratory function stable Cardiovascular status: blood pressure returned to baseline and stable Postop Assessment: spinal receding and no apparent nausea or vomiting Anesthetic complications: no    Last Vitals:  Vitals:   01/31/18 0000 01/31/18 0435  BP: 120/65 (!) 102/56  Pulse: 100 70  Resp: 16 18  Temp: (!) 36.4 C (!) 36.4 C  SpO2: 94% 97%    Last Pain:  Vitals:   01/31/18 0633  TempSrc:   PainSc: 5                  Vandora Jaskulski,E. Aryelle Figg

## 2018-03-06 ENCOUNTER — Other Ambulatory Visit (HOSPITAL_COMMUNITY): Payer: Self-pay | Admitting: Orthopedic Surgery

## 2018-03-06 ENCOUNTER — Ambulatory Visit (HOSPITAL_COMMUNITY)
Admission: RE | Admit: 2018-03-06 | Discharge: 2018-03-06 | Disposition: A | Discharge status: Home / Self Care | Payer: Medicare Other | Source: Ambulatory Visit | Attending: Orthopedic Surgery | Admitting: Orthopedic Surgery

## 2018-03-06 DIAGNOSIS — M79604 Pain in right leg: Secondary | ICD-10-CM | POA: Diagnosis not present

## 2018-03-06 DIAGNOSIS — M7989 Other specified soft tissue disorders: Principal | ICD-10-CM

## 2018-03-06 NOTE — Progress Notes (Signed)
Right lower extremity venous duplex completed - Preliminary results - There is no evidence of a DVT. Positive for a cystic type structure in the popliteal fossa [facility], [facility] 03/06/2018 2:30 PM

## 2018-10-06 IMAGING — DX DG KNEE 1-2V PORT*R*
2 series · 2 of 2 positions shown · non-contrast
Comparison: None.

CLINICAL DATA: 67-year-old male post right unicompartmental knee
replacement. Initial encounter.

EXAM:
PORTABLE RIGHT KNEE - 1-2 VIEW

[knee ap]
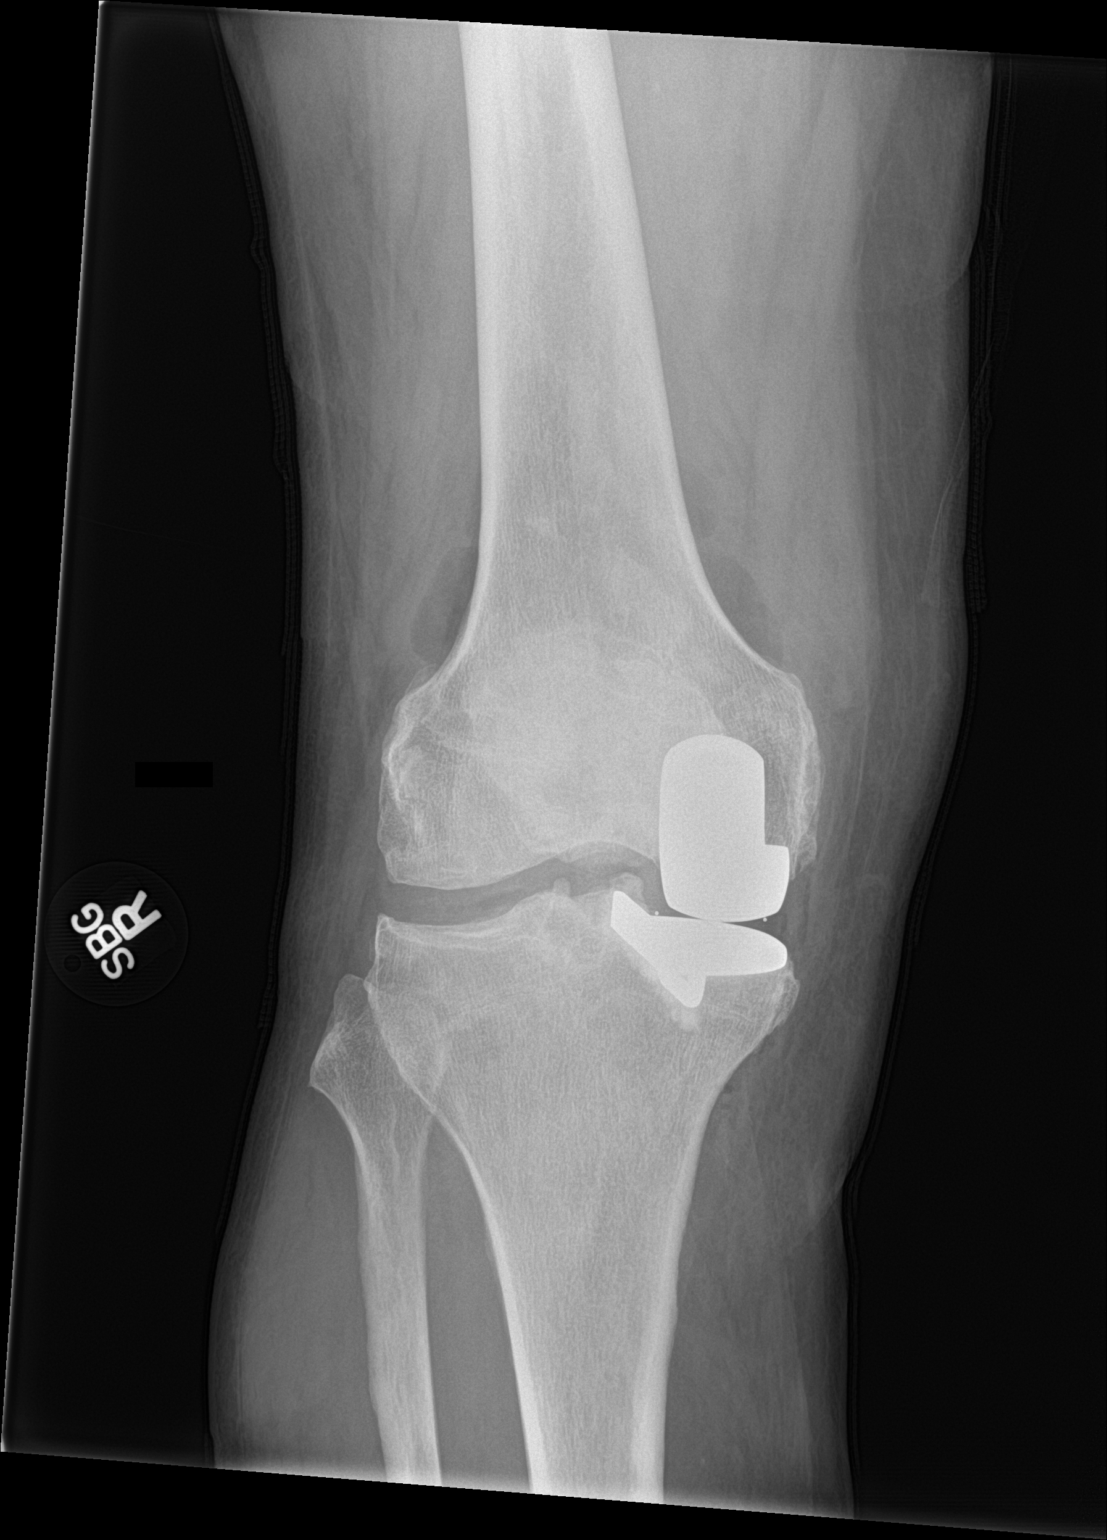

[knee lat]
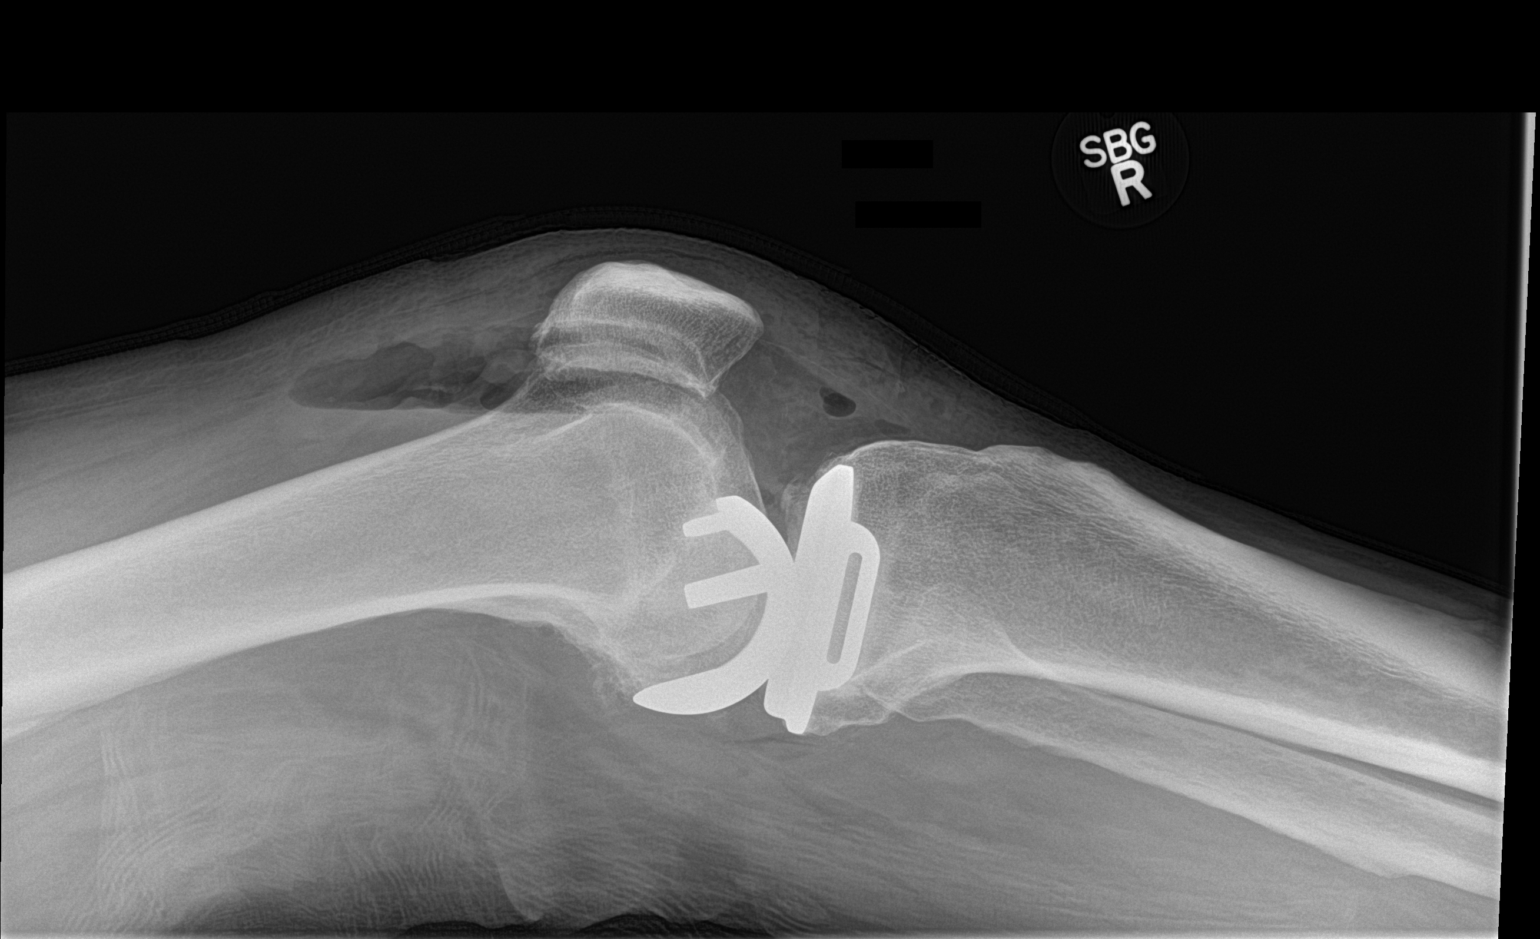

[2 of 2 positions shown; findings below may reference images not displayed]

FINDINGS: Post medial right knee hemiarthroplasty which appears in
satisfactory position without complication noted.

Patellofemoral joint degenerative changes.
IMPRESSION: Post right medial unicompartmental knee replacement.

## 2021-04-06 ENCOUNTER — Encounter (INDEPENDENT_AMBULATORY_CARE_PROVIDER_SITE_OTHER): Payer: Self-pay | Admitting: *Deleted

## 2021-04-28 ENCOUNTER — Encounter (INDEPENDENT_AMBULATORY_CARE_PROVIDER_SITE_OTHER): Payer: Self-pay

## 2021-04-28 ENCOUNTER — Other Ambulatory Visit (INDEPENDENT_AMBULATORY_CARE_PROVIDER_SITE_OTHER): Payer: Self-pay

## 2021-04-28 ENCOUNTER — Telehealth (INDEPENDENT_AMBULATORY_CARE_PROVIDER_SITE_OTHER): Payer: Self-pay

## 2021-04-28 DIAGNOSIS — Z86010 Personal history of colonic polyps: Secondary | ICD-10-CM

## 2021-04-28 DIAGNOSIS — I10 Essential (primary) hypertension: Secondary | ICD-10-CM

## 2021-04-28 DIAGNOSIS — Z1211 Encounter for screening for malignant neoplasm of colon: Secondary | ICD-10-CM

## 2021-04-28 MED ORDER — PEG 3350-KCL-NA BICARB-NACL 420 G PO SOLR: 4000.0000 mL | ORAL | 0 refills | Status: DC

## 2021-04-28 NOTE — Telephone Encounter (Signed)
LeighAnn Jacynda Brunke, CMA  

## 2021-04-28 NOTE — Telephone Encounter (Signed)
Referring MD/PCP: Sallee Provencal  Procedure: Tcs  Reason/Indication:  Hx of polyps   Has patient had this procedure before?  yes  If so, when, by whom and where?  2017(Epic)  Is there a family history of colon cancer?  no  Who?  What age when diagnosed?    Is patient diabetic? If yes, Type 1 or Type 2   no      Does patient have prosthetic heart valve or mechanical valve?  no  Do you have a pacemaker/defibrillator?  no  Has patient ever had endocarditis/atrial fibrillation? no  Does patient use oxygen? no  Has patient had joint replacement within last 12 months?  no  Is patient constipated or do they take laxatives? no  Does patient have a history of alcohol/drug use?  no  Have you had a stroke/heart attack last 6 mths? no  Do you take medicine for weight loss?  no  For male patients,: do you still have your menstrual cycle? N/A  Is patient on blood thinner such as Coumadin, Plavix and/or Aspirin? no  Medications: valsartan/hctz 80/12.5 mg daily, Vit D3 1000 IU daily, Vit C 500 mg daily, Naproxen 500 mg prn  Allergies: nkda  Medication Adjustment per Dr Laural Golden none  Procedure date & time: Thursday 05/27/21 at 9:30

## 2021-05-27 ENCOUNTER — Ambulatory Visit (HOSPITAL_COMMUNITY): Admit: 2021-05-27 | Payer: Medicare Other | Admitting: Internal Medicine

## 2021-05-27 ENCOUNTER — Encounter (HOSPITAL_COMMUNITY): Payer: Self-pay

## 2021-05-27 SURGERY — COLONOSCOPY
Anesthesia: Moderate Sedation

## 2021-08-25 ENCOUNTER — Encounter (INDEPENDENT_AMBULATORY_CARE_PROVIDER_SITE_OTHER): Payer: Self-pay

## 2021-08-25 ENCOUNTER — Other Ambulatory Visit (INDEPENDENT_AMBULATORY_CARE_PROVIDER_SITE_OTHER): Payer: Self-pay

## 2021-08-25 DIAGNOSIS — Z86010 Personal history of colonic polyps: Secondary | ICD-10-CM

## 2021-08-25 NOTE — Progress Notes (Signed)
Terbinafine 250 mg 1 tab po daily

## 2021-09-02 NOTE — Patient Instructions (Signed)
Jack Elliott [facility]  09/02/2021     @PREFPERIOPPHARMACY @   Your procedure is scheduled on  09/08/2021.   Report to [facility] at  [email or web address].   Call this number if you have problems the morning of surgery:  [phone]   Remember:  Follow the diet and prep instructions given to you by the office.    Take these medicines the morning of surgery with A SIP OF WATER                                           None     Do not wear jewelry, make-up or nail polish.  Do not wear lotions, powders, or perfumes, or deodorant.  Do not shave 48 hours prior to surgery.  Men may shave face and neck.  Do not bring valuables to the hospital.  [facility] is not responsible for any belongings or valuables.  Contacts, dentures or bridgework may not be worn into surgery.  Leave your suitcase in the car.  After surgery it may be brought to your room.  For patients admitted to the hospital, discharge time will be determined by your treatment team.  Patients discharged the day of surgery will not be allowed to drive home and must have someone with them for 24 hours.    Special instructions:   DO NOT smoke tobacco or vape for 24 hours before your procedure.  Please read over the following fact sheets that you were given. Anesthesia Post-op Instructions and Care and Recovery After Surgery      Colonoscopy, Adult, Care After This sheet gives you information about how to care for yourself after your procedure. Your health care provider may also give you more specific instructions. If you have problems or questions, contact your health care provider. What can I expect after the procedure? After the procedure, it is common to have: A small amount of blood in your stool for 24 hours after the procedure. Some gas. Mild cramping or bloating of your abdomen. Follow these instructions at home: Eating and drinking  Drink enough fluid to keep your urine pale yellow. Follow instructions from your  health care provider about eating or drinking restrictions. Resume your normal diet as instructed by your health care provider. Avoid heavy or fried foods that are hard to digest. Activity Rest as told by your health care provider. Avoid sitting for a long time without moving. Get up to take short walks every 1-2 hours. This is important to improve blood flow and breathing. Ask for help if you feel weak or unsteady. Return to your normal activities as told by your health care provider. Ask your health care provider what activities are safe for you. Managing cramping and bloating  Try walking around when you have cramps or feel bloated. Apply heat to your abdomen as told by your health care provider. Use the heat source that your health care provider recommends, such as a moist heat pack or a heating pad. Place a towel between your skin and the heat source. Leave the heat on for 20-30 minutes. Remove the heat if your skin turns bright red. This is especially important if you are unable to feel pain, heat, or cold. You may have a greater risk of getting burned. General instructions If you were given a sedative during the procedure, it can  affect you for several hours. Do not drive or operate machinery until your health care provider says that it is safe. For the first 24 hours after the procedure: Do not sign important documents. Do not drink alcohol. Do your regular daily activities at a slower pace than normal. Eat soft foods that are easy to digest. Take over-the-counter and prescription medicines only as told by your health care provider. Keep all follow-up visits as told by your health care provider. This is important. Contact a health care provider if: You have blood in your stool 2-3 days after the procedure. Get help right away if you have: More than a small spotting of blood in your stool. Large blood clots in your stool. Swelling of your abdomen. Nausea or vomiting. A  fever. Increasing pain in your abdomen that is not relieved with medicine. Summary After the procedure, it is common to have a small amount of blood in your stool. You may also have mild cramping and bloating of your abdomen. If you were given a sedative during the procedure, it can affect you for several hours. Do not drive or operate machinery until your health care provider says that it is safe. Get help right away if you have a lot of blood in your stool, nausea or vomiting, a fever, or increased pain in your abdomen. This information is not intended to replace advice given to you by your health care provider. Make sure you discuss any questions you have with your health care provider. Document Revised: 05/03/2019 Document Reviewed: 01/21/2019 Elsevier Patient Education  [place] After This sheet gives you information about how to care for yourself after your procedure. Your health care provider may also give you more specific instructions. If you have problems or questions, contact your health care provider. What can I expect after the procedure? After the procedure, it is common to have: Tiredness. Forgetfulness about what happened after the procedure. Impaired judgment for important decisions. Nausea or vomiting. Some difficulty with balance. Follow these instructions at home: For the time period you were told by your health care provider:   Rest as needed. Do not participate in activities where you could fall or become injured. Do not drive or use machinery. Do not drink alcohol. Do not take sleeping pills or medicines that cause drowsiness. Do not make important decisions or sign legal documents. Do not take care of children on your own. Eating and drinking Follow the diet that is recommended by your health care provider. Drink enough fluid to keep your urine pale yellow. If you vomit: Drink water, juice, or soup when you can drink  without vomiting. Make sure you have little or no nausea before eating solid foods. General instructions Have a responsible adult stay with you for the time you are told. It is important to have someone help care for you until you are awake and alert. Take over-the-counter and prescription medicines only as told by your health care provider. If you have sleep apnea, surgery and certain medicines can increase your risk for breathing problems. Follow instructions from your health care provider about wearing your sleep device: Anytime you are sleeping, including during daytime naps. While taking prescription pain medicines, sleeping medicines, or medicines that make you drowsy. Avoid smoking. Keep all follow-up visits as told by your health care provider. This is important. Contact a health care provider if: You keep feeling nauseous or you keep vomiting. You feel light-headed. You are still sleepy  or having trouble with balance after 24 hours. You develop a rash. You have a fever. You have redness or swelling around the IV site. Get help right away if: You have trouble breathing. You have new-onset confusion at home. Summary For several hours after your procedure, you may feel tired. You may also be forgetful and have poor judgment. Have a responsible adult stay with you for the time you are told. It is important to have someone help care for you until you are awake and alert. Rest as told. Do not drive or operate machinery. Do not drink alcohol or take sleeping pills. Get help right away if you have trouble breathing, or if you suddenly become confused. This information is not intended to replace advice given to you by your health care provider. Make sure you discuss any questions you have with your health care provider. Document Revised: 03/12/2020 Document Reviewed: 05/30/2019 Elsevier Patient Education  2022 [employer].

## 2021-09-03 ENCOUNTER — Encounter (HOSPITAL_COMMUNITY): Payer: Self-pay

## 2021-09-03 ENCOUNTER — Encounter (HOSPITAL_COMMUNITY)
Admission: RE | Admit: 2021-09-03 | Discharge: 2021-09-03 | Disposition: A | Discharge status: Home / Self Care | Payer: Medicare Other | Source: Ambulatory Visit | Attending: Internal Medicine | Admitting: Internal Medicine

## 2021-09-03 ENCOUNTER — Other Ambulatory Visit: Payer: Self-pay

## 2021-09-03 VITALS — BP 146/69 | HR 83 | Temp 97.8°F | Resp 18 | Ht 71.0 in | Wt 218.0 lb

## 2021-09-03 DIAGNOSIS — Z01818 Encounter for other preprocedural examination: Secondary | ICD-10-CM | POA: Diagnosis not present

## 2021-09-03 DIAGNOSIS — D649 Anemia, unspecified: Secondary | ICD-10-CM

## 2021-09-08 ENCOUNTER — Encounter (INDEPENDENT_AMBULATORY_CARE_PROVIDER_SITE_OTHER): Payer: Self-pay | Admitting: *Deleted

## 2021-09-08 ENCOUNTER — Ambulatory Visit (HOSPITAL_COMMUNITY)
Admission: RE | Admit: 2021-09-08 | Discharge: 2021-09-08 | Disposition: A | Discharge status: Home / Self Care | Payer: Medicare Other | Attending: Internal Medicine | Admitting: Internal Medicine

## 2021-09-08 ENCOUNTER — Encounter (HOSPITAL_COMMUNITY)
Admission: RE | Disposition: A | Discharge status: Home / Self Care | Payer: Self-pay | Source: Home / Self Care | Attending: Internal Medicine

## 2021-09-08 ENCOUNTER — Ambulatory Visit (HOSPITAL_COMMUNITY): Payer: Medicare Other | Admitting: Anesthesiology

## 2021-09-08 ENCOUNTER — Ambulatory Visit (HOSPITAL_BASED_OUTPATIENT_CLINIC_OR_DEPARTMENT_OTHER): Payer: Medicare Other | Admitting: Anesthesiology

## 2021-09-08 DIAGNOSIS — Z86010 Personal history of colonic polyps: Secondary | ICD-10-CM

## 2021-09-08 DIAGNOSIS — K648 Other hemorrhoids: Secondary | ICD-10-CM

## 2021-09-08 DIAGNOSIS — K635 Polyp of colon: Secondary | ICD-10-CM | POA: Diagnosis not present

## 2021-09-08 DIAGNOSIS — I10 Essential (primary) hypertension: Secondary | ICD-10-CM | POA: Diagnosis not present

## 2021-09-08 DIAGNOSIS — K5730 Diverticulosis of large intestine without perforation or abscess without bleeding: Secondary | ICD-10-CM

## 2021-09-08 DIAGNOSIS — R251 Tremor, unspecified: Secondary | ICD-10-CM | POA: Insufficient documentation

## 2021-09-08 DIAGNOSIS — Z1211 Encounter for screening for malignant neoplasm of colon: Secondary | ICD-10-CM | POA: Insufficient documentation

## 2021-09-08 DIAGNOSIS — M1990 Unspecified osteoarthritis, unspecified site: Secondary | ICD-10-CM | POA: Diagnosis not present

## 2021-09-08 DIAGNOSIS — K219 Gastro-esophageal reflux disease without esophagitis: Secondary | ICD-10-CM | POA: Diagnosis not present

## 2021-09-08 DIAGNOSIS — D649 Anemia, unspecified: Secondary | ICD-10-CM | POA: Insufficient documentation

## 2021-09-08 DIAGNOSIS — Z09 Encounter for follow-up examination after completed treatment for conditions other than malignant neoplasm: Secondary | ICD-10-CM | POA: Diagnosis not present

## 2021-09-08 DIAGNOSIS — D122 Benign neoplasm of ascending colon: Secondary | ICD-10-CM | POA: Insufficient documentation

## 2021-09-08 DIAGNOSIS — K644 Residual hemorrhoidal skin tags: Secondary | ICD-10-CM | POA: Insufficient documentation

## 2021-09-08 DIAGNOSIS — K6289 Other specified diseases of anus and rectum: Secondary | ICD-10-CM

## 2021-09-08 DIAGNOSIS — D123 Benign neoplasm of transverse colon: Secondary | ICD-10-CM | POA: Insufficient documentation

## 2021-09-08 DIAGNOSIS — Z800 Family history of malignant neoplasm of digestive organs: Secondary | ICD-10-CM | POA: Diagnosis not present

## 2021-09-08 HISTORY — PX: POLYPECTOMY: SHX149

## 2021-09-08 HISTORY — PX: COLONOSCOPY WITH PROPOFOL: SHX5780

## 2021-09-08 LAB — HM COLONOSCOPY

## 2021-09-08 SURGERY — COLONOSCOPY WITH PROPOFOL
Anesthesia: General

## 2021-09-08 MED ORDER — PROPOFOL 10 MG/ML IV BOLUS
INTRAVENOUS | Status: DC | PRN
  Administered 2021-09-08 (×2): 20 mg via INTRAVENOUS
  Administered 2021-09-08: 150 mg via INTRAVENOUS
  Administered 2021-09-08: 20 mg via INTRAVENOUS
  Administered 2021-09-08: 30 mg via INTRAVENOUS

## 2021-09-08 MED ORDER — LACTATED RINGERS IV SOLN: INTRAVENOUS | Status: DC

## 2021-09-08 NOTE — H&P (Signed)
Jack Elliott is an 71 y.o. male.   ?Chief Complaint: Patient is here for colonoscopy ?HPI: Patient is 71 year old Caucasian male who is here for surveillance colonoscopy.  He had tubular adenoma removed from his colon in 2014.  He underwent colonoscopy for iron deficiency anemia in 2017 and no polyps were identified.  He denies abdominal pain change in bowel habits or rectal bleeding.  He does not take aspirin anymore. ?Family history significant for colon carcinoma in maternal aunt and uncle.  He does not remember their ages at the time of diagnosis. ? ?Past Medical History:  ?Diagnosis Date  ? Anemia   ? Arthritis   ? Dysrhythmia   ? Essential hypertension 03/22/2016  ? GERD (gastroesophageal reflux disease)   ? occasionally  OTC Tums  ? History of kidney stones   ? 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 Primary localized osteoarthritis of right knee 01/30/2018  ? ? ?Past Surgical History:  ?Procedure Laterality Date  ? COLONOSCOPY N/A 04/29/2016  ? Procedure: COLONOSCOPY;  Surgeon: Rogene Houston, MD;  [facility];  Service: Endoscopy;  Laterality: N/A;  ? CYST EXCISION Right   ? Right Wrist  ? CYST EXCISION    ? back of neck  ? ESOPHAGOGASTRODUODENOSCOPY N/A 04/29/2016  ? Procedure: ESOPHAGOGASTRODUODENOSCOPY (EGD);  Surgeon: Rogene Houston, MD;  Location: [facility];  Service: Endoscopy;  Laterality: N/A;  2:15  ? kidney stone surgery with laser    ? KNEE ARTHROSCOPY    ? PARTIAL KNEE ARTHROPLASTY Right 01/30/2018  ? Procedure: UNICOMPARTMENTAL KNEE;  Surgeon: Marchia Bond, MD;  Location: [place];  Service: Orthopedics;  Laterality: Right;  ? TONSILLECTOMY    ? ? ?Family History  ?Problem Relation Age of Onset  ? Multiple sclerosis Mother   ? Breast cancer Mother   ? Bladder Cancer Father   ? Emphysema Father   ? ?Social History:  reports that he has never smoked. He has never used smokeless tobacco. He reports that he does not drink alcohol and  does not use drugs. ? ?Allergies:  ?Allergies  ?Allergen Reactions  ? Penicillins Rash  ?  Pt states he has been taking amoxicillin with no issues ? ?Has patient had a PCN reaction causing immediate rash, facial/tongue/throat swelling, SOB or lightheadedness with hypotension: No ?Has patient had a PCN reaction causing severe rash involving mucus membranes or skin necrosis: No ?Has patient had a PCN reaction that required hospitalization No ?Has patient had a PCN reaction occurring within the last 10 years: No ?Patient stated felt like spiders were crawling on him ? ?  ? ? ?Medications Prior to Admission  ?Medication Sig Dispense Refill  ? acetaminophen (TYLENOL) 325 MG tablet Take 650 mg by mouth every 6 (six) hours as needed for moderate pain.    ? Ascorbic Acid (VITAMIN C PO) Take 1 tablet by mouth daily.    ? cholecalciferol (VITAMIN D) 1000 units tablet Take 1,000 Units by mouth daily.    ? diphenhydrAMINE HCl (ZZZQUIL) 50 MG/30ML LIQD Take 50 mg by mouth at bedtime as needed (sleep).    ? Miconazole Nitrate 2 % AERO Apply 1 spray topically daily as needed (toenail fungus).    ? terbinafine (LAMISIL) 250 MG tablet Take 250 mg by mouth daily.    ? valsartan-hydrochlorothiazide (DIOVAN-HCT) 80-12.5 MG tablet Take 1 tablet by mouth daily.    ? aspirin EC 325 MG tablet Take 1  tablet (325 mg total) by mouth daily. (Patient not taking: Reported on 08/31/2021) 30 tablet 0  ? baclofen (LIORESAL) 10 MG tablet Take 1 tablet (10 mg total) by mouth 3 (three) times daily. As needed for muscle spasm (Patient not taking: Reported on 08/31/2021) 50 tablet 0  ? HYDROcodone-acetaminophen (NORCO) 10-325 MG tablet Take 1 tablet by mouth every 6 (six) hours as needed. (Patient not taking: Reported on 08/31/2021) 28 tablet 0  ? ondansetron (ZOFRAN) 4 MG tablet Take 1 tablet (4 mg total) by mouth every 8 (eight) hours as needed for nausea or vomiting. (Patient not taking: Reported on 08/31/2021) 10 tablet 0  ? polyethylene  glycol-electrolytes (TRILYTE) 420 g solution Take 4,000 mLs by mouth as directed. 4000 mL 0  ? sennosides-docusate sodium (SENOKOT-S) 8.6-50 MG tablet Take 2 tablets by mouth daily. (Patient not taking: Reported on 08/31/2021) 30 tablet 1  ? sildenafil (VIAGRA) 50 MG tablet Take 50 mg by mouth daily as needed for erectile dysfunction.    ? ? ?No results found for this or any previous visit (from the past 48 hour(s)). ?No results found. ? ?Review of Systems ? ?Blood pressure 140/74, pulse 83, temperature 98.2 ?F (36.8 ?C), temperature source Oral, resp. rate (!) 22, height 5\' 11"  (1.803 m), weight 99.8 kg, SpO2 98 %. ?Physical Exam ?HENT:  ?   Mouth/Throat:  ?   Mouth: Mucous membranes are moist.  ?   Pharynx: Oropharynx is clear.  ?Eyes:  ?   General: No scleral icterus. ?   Conjunctiva/sclera: Conjunctivae normal.  ?Cardiovascular:  ?   Rate and Rhythm: Normal rate and regular rhythm.  ?   Heart sounds: Normal heart sounds. No murmur heard. ?Pulmonary:  ?   Effort: Pulmonary effort is normal.  ?   Breath sounds: Normal breath sounds.  ?Abdominal:  ?   General: There is no distension.  ?   Palpations: Abdomen is soft. There is no mass.  ?   Tenderness: There is no abdominal tenderness.  ?Musculoskeletal:     ?   General: No swelling.  ?   Cervical back: Neck supple.  ?Lymphadenopathy:  ?   Cervical: No cervical adenopathy.  ?Skin: ?   General: Skin is warm and dry.  ?Neurological:  ?   Mental Status: He is alert.  ?  ? ?Assessment/Plan ? ?History of colonic adenoma. ?Family history of colon carcinoma in 2 second-degree relatives. ?Surveillance colonoscopy. ? ?Hildred Laser, MD ?09/08/2021, 8:44 AM ? ? ? ?

## 2021-09-08 NOTE — Anesthesia Postprocedure Evaluation (Signed)
Anesthesia Post Note ? ?Patient: Jack Elliott ? ?Procedure(s) Performed: COLONOSCOPY WITH PROPOFOL ?POLYPECTOMY INTESTINAL ? ?Patient location during evaluation: Phase II ?Anesthesia Type: General ?Level of consciousness: awake and alert and oriented ?Pain management: pain level controlled ?Vital Signs Assessment: post-procedure vital signs reviewed and stable ?Respiratory status: spontaneous breathing, nonlabored ventilation and respiratory function stable ?Cardiovascular status: blood pressure returned to baseline and stable ?Postop Assessment: no apparent nausea or vomiting ?Anesthetic complications: no ? ? ?No notable events documented. ? ? ?Last Vitals:  ?Vitals:  ? 09/08/21 0800 09/08/21 0913  ?BP: 140/74 (!) 99/40  ?Pulse: 83 82  ?Resp: (!) 22 (!) 23  ?Temp: 36.8 ?C 36.5 ?C  ?SpO2: 98% 96%  ?  ?Last Pain:  ?Vitals:  ? 09/08/21 0913  ?TempSrc: Oral  ?PainSc: 0-No pain  ? ? ?  ?  ?  ?  ?  ?  ? ?Yesha Muchow C Kalvyn Desa ? ? ? ? ?

## 2021-09-08 NOTE — Anesthesia Procedure Notes (Signed)
Date/Time: 09/08/2021 8:48 AM ?Performed by: Vista Deck, CRNA ?Pre-anesthesia Checklist: Patient identified, Emergency Drugs available, Suction available, Timeout performed and Patient being monitored ?Patient Re-evaluated:Patient Re-evaluated prior to induction ?Oxygen Delivery Method: Nasal Cannula ? ? ? ? ?

## 2021-09-08 NOTE — Discharge Instructions (Signed)
No aspirin or NSAIDs for 24 hours ?Resume scheduled medications as before. ?Metamucil 1 heaping tablespoonful or 4 g by mouth daily at bedtime. ?High-fiber diet. ?No driving for 24 hours. ?Physician or office will contact you with biopsy results. ?

## 2021-09-08 NOTE — Anesthesia Preprocedure Evaluation (Signed)
Anesthesia Evaluation  ?Patient identified by MRN, date of birth, ID band ?Patient awake ? ? ? ?Reviewed: ?Allergy & Precautions, NPO status , Patient's Chart, lab work & pertinent test results ? ?Airway ?Mallampati: III ? ?TM Distance: >3 FB ?Neck ROM: Full ? ? ? Dental ? ?(+) Dental Advisory Given, Chipped ?  ?Pulmonary ?neg pulmonary ROS,  ?  ?Pulmonary exam normal ?breath sounds clear to auscultation ? ? ? ? ? ? Cardiovascular ?Exercise Tolerance: Good ?hypertension, Pt. on medications ?Normal cardiovascular exam+ dysrhythmias  ?Rhythm:Regular Rate:Normal ? ?03-Sep-2021 14:45:17 [place] [facility]-AP-300 ROUTINE RECORD ?1950/07/28 (53 yr) ?Male Caucasian ?Vent. rate 80 BPM ?PR interval 144 ms ?QRS duration 80 ms ?QT/QTcB 352/405 ms ?P-R-T axes 37 69 8 ?Sinus rhythm with marked sinus arrhythmia ?Cannot rule out Inferior infarct , age undetermined ?Abnormal ECG ?No previous ECGs available ?Low voltage QRS ?Confirmed by Asencion Noble [id number]) on 09/06/2021 10:39:15 PM ?  ?Neuro/Psych ?negative neurological ROS ? negative psych ROS  ? GI/Hepatic ?Neg liver ROS, GERD  Controlled,  ?Endo/Other  ?negative endocrine ROS ? Renal/GU ?negative Renal ROS  ?negative genitourinary ?  ?Musculoskeletal ? ?(+) Arthritis , Osteoarthritis,   ? Abdominal ?  ?Peds ?negative pediatric ROS ?(+)  Hematology ? ?(+) Blood dyscrasia, anemia ,   ?Anesthesia Other Findings ?Right upper extremity tremor ? Reproductive/Obstetrics ?negative OB ROS ? ?  ? ? ? ? ? ? ? ? ? ? ? ? ? ?  ?  ? ? ? ? ? ? ? ?Anesthesia Physical ?Anesthesia Plan ? ?ASA: 2 ? ?Anesthesia Plan: General  ? ?Post-op Pain Management: Minimal or no pain anticipated  ? ?Induction: Intravenous ? ?PONV Risk Score and Plan: TIVA ? ?Airway Management Planned: Nasal Cannula and Natural Airway ? ?Additional Equipment:  ? ?Intra-op Plan:  ? ?Post-operative Plan:  ? ?Informed Consent: I have reviewed the patients History and Physical, chart, labs and  discussed the procedure including the risks, benefits and alternatives for the proposed anesthesia with the patient or authorized representative who has indicated his/her understanding and acceptance.  ? ? ? ?Dental advisory given ? ?Plan Discussed with: CRNA and Surgeon ? ?Anesthesia Plan Comments:   ? ? ? ? ? ?Anesthesia Quick Evaluation ? ?

## 2021-09-08 NOTE — Op Note (Signed)
[facility] ?Patient Name: Jack Elliott ?Procedure Date: 09/08/2021 8:30 AM ?MRN: [id number] ?Date of Birth: Feb 09, 1951 ?Attending MD: Hildred Laser , MD ?CSN: [id number] ?Age: 71 ?Admit Type: Outpatient ?Procedure:                Colonoscopy ?Indications:              High risk colon cancer surveillance: Personal  ?                          history of colonic polyps ?Providers:                Hildred Laser, MD, [place] Page, Wynonia Musty  ?                          Tech, Technician ?Referring MD:             Sherrilee Gilles, DO ?Medicines:                Propofol per Anesthesia ?Complications:            No immediate complications. ?Estimated Blood Loss:     Estimated blood loss was minimal. ?Procedure:                Pre-Anesthesia Assessment: ?                          - Prior to the procedure, a History and Physical  ?                          was performed, and patient medications and  ?                          allergies were reviewed. The patient's tolerance of  ?                          previous anesthesia was also reviewed. The risks  ?                          and benefits of the procedure and the sedation  ?                          options and risks were discussed with the patient.  ?                          All questions were answered, and informed consent  ?                          was obtained. Prior Anticoagulants: The patient has  ?                          taken no previous anticoagulant or antiplatelet  ?                          agents. ASA Grade Assessment: II - A patient with  ?  mild systemic disease. After reviewing the risks  ?                          and benefits, the patient was deemed in  ?                          satisfactory condition to undergo the procedure. ?                          After obtaining informed consent, the colonoscope  ?                          was passed under direct vision. Throughout the  ?                          procedure, the  patient's blood pressure, pulse, and  ?                          oxygen saturations were monitored continuously. The  ?                          PCF-HQ190L ([id number]) scope was introduced through  ?                          the anus and advanced to the the cecum, identified  ?                          by appendiceal orifice and ileocecal valve. The  ?                          colonoscopy was performed without difficulty. The  ?                          patient tolerated the procedure well. The quality  ?                          of the bowel preparation was good. The ileocecal  ?                          valve, appendiceal orifice, and rectum were  ?                          photographed. ?Scope In: 8:54:05 AM ?Scope Out: 9:09:04 AM ?Scope Withdrawal Time: 0 hours 11 minutes 48 seconds  ?Total Procedure Duration: 0 hours 14 minutes 59 seconds  ?Findings: ?     Skin tags were found on perianal exam. ?     Two polyps were found in the splenic flexure and ascending colon. The  ?     polyps were small in size. These were biopsied with a cold forceps for  ?     histology. The pathology specimen was placed into Bottle Number 1. ?     Multiple diverticula were found in the sigmoid colon and splenic flexure. ?     External hemorrhoids were found during retroflexion. The hemorrhoids  ?  were small. ?Impression:               - Perianal skin tags found on perianal exam. ?                          - Two small polyps at the splenic flexure and in  ?                          the ascending colon. Biopsied. ?                          - Diverticulosis in the sigmoid colon and at the  ?                          splenic flexure. ?                          - External hemorrhoids. ?Moderate Sedation: ?     Per Anesthesia Care ?Recommendation:           - Patient has a contact number available for  ?                          emergencies. The signs and symptoms of potential  ?                          delayed complications were  discussed with the  ?                          patient. Return to normal activities tomorrow.  ?                          Written discharge instructions were provided to the  ?                          patient. ?                          - High fiber diet today. ?                          - Continue present medications. ?                          - No aspirin, ibuprofen, naproxen, or other  ?                          non-steroidal anti-inflammatory drugs for 1 day. ?                          - Await pathology results. ?                          - Repeat colonoscopy is recommended. The  ?                          colonoscopy date will be determined after pathology  ?  results from today's exam become available for  ?                          review. ?Procedure Code(s):        --- Professional --- ?                          [phone], Colonoscopy, flexible; with biopsy, single  ?                          or multiple ?Diagnosis Code(s):        --- Professional --- ?                          K64.4, Residual hemorrhoidal skin tags ?                          Z86.010, Personal history of colonic polyps ?                          K63.5, Polyp of colon ?                          K57.30, Diverticulosis of large intestine without  ?                          perforation or abscess without bleeding ?CPT copyright 2019 [employer]. All rights reserved. ?The codes documented in this report are preliminary and upon coder review may  ?be revised to meet current compliance requirements. ?Hildred Laser, MD ?Hildred Laser, MD ?09/08/2021 9:18:05 AM ?This report has been signed electronically. ?Number of Addenda: 0 ?

## 2021-09-08 NOTE — Transfer of Care (Signed)
Immediate Anesthesia Transfer of Care Note ? ?Patient: Jack Elliott ? ?Procedure(s) Performed: COLONOSCOPY WITH PROPOFOL ?POLYPECTOMY INTESTINAL ? ?Patient Location: Short Stay ? ?Anesthesia Type:General ? ?Level of Consciousness: awake and patient cooperative ? ?Airway & Oxygen Therapy: Patient Spontanous Breathing ? ?Post-op Assessment: Report given to RN and Post -op Vital signs reviewed and stable ? ?Post vital signs: Reviewed and stable ? ?Last Vitals:  ?Vitals Value Taken Time  ?BP 99/40 09/08/21 0913  ?Temp 36.5 ?C 09/08/21 0913  ?Pulse 82 09/08/21 0913  ?Resp 23 09/08/21 0913  ?SpO2 96 % 09/08/21 0913  ? ? ?Last Pain:  ?Vitals:  ? 09/08/21 0913  ?TempSrc: Oral  ?PainSc: 0-No pain  ?   ? ?Patients Stated Pain Goal: 7 (09/08/21 0800) ? ?Complications: No notable events documented. ?

## 2021-09-09 LAB — SURGICAL PATHOLOGY

## 2021-09-14 ENCOUNTER — Encounter (HOSPITAL_COMMUNITY): Payer: Self-pay | Admitting: Internal Medicine

## 2023-03-23 ENCOUNTER — Emergency Department (HOSPITAL_COMMUNITY): Payer: Medicare Other

## 2023-03-23 ENCOUNTER — Emergency Department (HOSPITAL_COMMUNITY)
Admission: EM | Admit: 2023-03-23 | Discharge: 2023-03-23 | Disposition: A | Discharge status: Home / Self Care | Payer: Medicare Other | Attending: Emergency Medicine | Admitting: Emergency Medicine

## 2023-03-23 ENCOUNTER — Other Ambulatory Visit: Payer: Self-pay

## 2023-03-23 ENCOUNTER — Encounter (HOSPITAL_COMMUNITY): Payer: Self-pay

## 2023-03-23 DIAGNOSIS — R42 Dizziness and giddiness: Secondary | ICD-10-CM | POA: Insufficient documentation

## 2023-03-23 DIAGNOSIS — I10 Essential (primary) hypertension: Secondary | ICD-10-CM | POA: Insufficient documentation

## 2023-03-23 DIAGNOSIS — I672 Cerebral atherosclerosis: Secondary | ICD-10-CM | POA: Insufficient documentation

## 2023-03-23 LAB — COMPREHENSIVE METABOLIC PANEL
ALT: 19 U/L (ref 0–44)
AST: 17 U/L (ref 15–41)
Albumin: 4.2 g/dL (ref 3.5–5.0)
Alkaline Phosphatase: 113 U/L (ref 38–126)
Anion gap: 14 (ref 5–15)
BUN: 10 mg/dL (ref 8–23)
CO2: 24 mmol/L (ref 22–32)
Calcium: 9.4 mg/dL (ref 8.9–10.3)
Chloride: 102 mmol/L (ref 98–111)
Creatinine, Ser: 0.66 mg/dL (ref 0.61–1.24)
GFR, Estimated: >60 mL/min (ref >60)
Glucose, Bld: 110 mg/dL — ABNORMAL HIGH (ref 70–99)
Potassium: 4.2 mmol/L (ref 3.5–5.1)
Sodium: 140 mmol/L (ref 135–145)
Total Bilirubin: 0.9 mg/dL (ref 0.3–1.2)
Total Protein: 7.5 g/dL (ref 6.5–8.1)

## 2023-03-23 LAB — DIFFERENTIAL
Abs Immature Granulocytes: 0.02 10*3/uL (ref 0.00–0.07)
Basophils Absolute: 0 10*3/uL (ref 0.0–0.1)
Basophils Relative: 0 %
Eosinophils Absolute: 0.1 10*3/uL (ref 0.0–0.5)
Eosinophils Relative: 1 %
Immature Granulocytes: 0 %
Lymphocytes Relative: 25 %
Lymphs Abs: 1.9 10*3/uL (ref 0.7–4.0)
Monocytes Absolute: 0.6 10*3/uL (ref 0.1–1.0)
Monocytes Relative: 8 %
Neutro Abs: 5.1 10*3/uL (ref 1.7–7.7)
Neutrophils Relative %: 66 %

## 2023-03-23 LAB — APTT: aPTT: 26 s (ref 24–36)

## 2023-03-23 LAB — URINALYSIS, ROUTINE W REFLEX MICROSCOPIC
Bacteria, UA: NONE SEEN
Bilirubin Urine: NEGATIVE
Glucose, UA: NEGATIVE mg/dL
Ketones, ur: 5 mg/dL — AB
Leukocytes,Ua: NEGATIVE
Nitrite: NEGATIVE
Protein, ur: NEGATIVE mg/dL
Specific Gravity, Urine: 1.005 (ref 1.005–1.030)
pH: 5 (ref 5.0–8.0)

## 2023-03-23 LAB — CBC
HCT: 51.5 % (ref 39.0–52.0)
Hemoglobin: 17.2 g/dL — ABNORMAL HIGH (ref 13.0–17.0)
MCH: 30.1 pg (ref 26.0–34.0)
MCHC: 33.4 g/dL (ref 30.0–36.0)
MCV: 90.2 fL (ref 80.0–100.0)
Platelets: 188 10*3/uL (ref 150–400)
RBC: 5.71 MIL/uL (ref 4.22–5.81)
RDW: 14 % (ref 11.5–15.5)
WBC: 7.7 10*3/uL (ref 4.0–10.5)
nRBC: 0 % (ref 0.0–0.2)

## 2023-03-23 LAB — RAPID URINE DRUG SCREEN, HOSP PERFORMED
Amphetamines: NOT DETECTED
Barbiturates: NOT DETECTED
Benzodiazepines: NOT DETECTED
Cocaine: NOT DETECTED
Opiates: NOT DETECTED
Tetrahydrocannabinol: NOT DETECTED

## 2023-03-23 LAB — I-STAT CHEM 8, ED
BUN: 10 mg/dL (ref 8–23)
Calcium, Ion: 1.2 mmol/L (ref 1.15–1.40)
Chloride: 103 mmol/L (ref 98–111)
Creatinine, Ser: 0.7 mg/dL (ref 0.61–1.24)
Glucose, Bld: 106 mg/dL — ABNORMAL HIGH (ref 70–99)
HCT: 52 % (ref 39.0–52.0)
Hemoglobin: 17.7 g/dL — ABNORMAL HIGH (ref 13.0–17.0)
Potassium: 4.5 mmol/L (ref 3.5–5.1)
Sodium: 141 mmol/L (ref 135–145)
TCO2: 25 mmol/L (ref 22–32)

## 2023-03-23 LAB — PROTIME-INR
INR: 1 (ref 0.8–1.2)
Prothrombin Time: 13.7 s (ref 11.4–15.2)

## 2023-03-23 LAB — ETHANOL: Alcohol, Ethyl (B): <10 mg/dL (ref <10)

## 2023-03-23 LAB — CBG MONITORING, ED: Glucose-Capillary: 115 mg/dL — ABNORMAL HIGH (ref 70–99)

## 2023-03-23 MED ORDER — MECLIZINE HCL 12.5 MG PO TABS
25.0000 mg | ORAL_TABLET | Freq: Once | ORAL | Status: AC
  Administered 2023-03-23: 25 mg via ORAL
  Filled 2023-03-23: qty 2

## 2023-03-23 MED ORDER — MECLIZINE HCL 25 MG PO TABS: 25.0000 mg | ORAL_TABLET | Freq: Three times a day (TID) | ORAL | 0 refills | Status: AC | PRN

## 2023-03-23 MED ORDER — ONDANSETRON HCL 4 MG/2ML IJ SOLN
4.0000 mg | Freq: Once | INTRAMUSCULAR | Status: AC
  Administered 2023-03-23: 4 mg via INTRAVENOUS
  Filled 2023-03-23: qty 2

## 2023-03-23 MED ORDER — SODIUM CHLORIDE 0.9 % IV BOLUS
500.0000 mL | Freq: Once | INTRAVENOUS | Status: AC
  Administered 2023-03-23: 500 mL via INTRAVENOUS

## 2023-03-23 NOTE — ED Notes (Signed)
Patient transported to MRI 

## 2023-03-23 NOTE — ED Notes (Signed)
Less than 24 hours, within [facility] policy per charge RN Stroke alert initiated.

## 2023-03-23 NOTE — ED Triage Notes (Signed)
Woke up this morning and was dizzy when he rolled over to look at clock  Laid in bed until 08:00 was dizzy walking  Felt like he was going to fall over in the kitchen, lost balance and was dizzy.  Around 15:00 fell against wall when walking. Was dizzy.  Seen by PCP today, pt stated that he passed all NP's tests but was told to come to ED for CT to r/o stroke.

## 2023-03-23 NOTE — ED Provider Notes (Signed)
[place] EMERGENCY DEPARTMENT AT [facility] Provider Note   CSN: [id number] Arrival date & time: 03/23/23  1729     History  Chief Complaint  Patient presents with   Dizziness    Jack Elliott is a 72 y.o. male.  Patient states he awoke today at 4 AM and was dizzy and has been dizzy and unsteady ever since.  Patient has a history of hypertension  The history is provided by the patient and medical records. No language interpreter was used.  Dizziness Quality:  Head spinning Severity:  Moderate Onset quality:  Sudden Timing:  Constant Progression:  Waxing and waning Chronicity:  New Context: bending over   Relieved by:  Nothing Associated symptoms: no chest pain, no diarrhea and no headaches        Home Medications Prior to Admission medications   Medication Sig Start Date End Date Taking? Authorizing Provider  acetaminophen (TYLENOL) 325 MG tablet Take 650 mg by mouth every 6 (six) hours as needed for moderate pain.   Yes [provider]  cholecalciferol (VITAMIN D) 1000 units tablet Take 1,000 Units by mouth daily.   Yes [provider]  cyanocobalamin 1000 MCG tablet Take 1,000 mcg by mouth daily. 11/15/22  Yes [provider]  diphenhydrAMINE HCl (ZZZQUIL) 50 MG/30ML LIQD Take 50 mg by mouth at bedtime as needed (sleep).   Yes [provider]  meclizine (ANTIVERT) 25 MG tablet Take 1 tablet (25 mg total) by mouth 3 (three) times daily as needed for dizziness. 03/23/23  Yes Bethann Berkshire, MD  naproxen sodium (ALEVE) 220 MG tablet Take 220 mg by mouth daily as needed (pain).   Yes [provider]  valsartan-hydrochlorothiazide (DIOVAN-HCT) 80-12.5 MG tablet Take 1 tablet by mouth daily.   Yes [provider]      Allergies    Penicillins    Review of Systems   Review of Systems  Constitutional:  Negative for appetite change and fatigue.  HENT:  Negative for congestion, ear discharge and sinus  pressure.   Eyes:  Negative for discharge.  Respiratory:  Negative for cough.   Cardiovascular:  Negative for chest pain.  Gastrointestinal:  Negative for abdominal pain and diarrhea.  Genitourinary:  Negative for frequency and hematuria.  Musculoskeletal:  Negative for back pain.  Skin:  Negative for rash.  Neurological:  Positive for dizziness. Negative for seizures and headaches.  Psychiatric/Behavioral:  Negative for hallucinations.     Physical Exam Updated Vital Signs BP 124/70   Pulse 86   Temp 98.3 F (36.8 C) (Oral)   Resp (!) 25   Ht 5\' 11"  (1.803 m)   Wt 96.8 kg   SpO2 95%   BMI 29.75 kg/m  Physical Exam Vitals and nursing note reviewed.  Constitutional:      Appearance: He is well-developed.  HENT:     Head: Normocephalic.     Nose: Nose normal.  Eyes:     General: No scleral icterus.    Conjunctiva/sclera: Conjunctivae normal.     Comments: No nystagmus  Neck:     Thyroid: No thyromegaly.  Cardiovascular:     Rate and Rhythm: Normal rate and regular rhythm.     Heart sounds: No murmur heard.    No friction rub. No gallop.  Pulmonary:     Breath sounds: No stridor. No wheezing or rales.  Chest:     Chest wall: No tenderness.  Abdominal:     General: There is no  distension.     Tenderness: There is no abdominal tenderness. There is no rebound.  Musculoskeletal:        General: Normal range of motion.     Cervical back: Neck supple.  Lymphadenopathy:     Cervical: No cervical adenopathy.  Skin:    Findings: No erythema or rash.  Neurological:     Mental Status: He is alert and oriented to person, place, and time.     Motor: No abnormal muscle tone.     Coordination: Coordination normal.  Psychiatric:        Behavior: Behavior normal.     ED Results / Procedures / Treatments   Labs (all labs ordered are listed, but only abnormal results are displayed) Labs Reviewed  CBC - Abnormal; Notable for the following components:      Result Value    Hemoglobin 17.2 (*)    All other components within normal limits  COMPREHENSIVE METABOLIC PANEL - Abnormal; Notable for the following components:   Glucose, Bld 110 (*)    All other components within normal limits  URINALYSIS, ROUTINE W REFLEX MICROSCOPIC - Abnormal; Notable for the following components:   Color, Urine STRAW (*)    Hgb urine dipstick SMALL (*)    Ketones, ur 5 (*)    All other components within normal limits  CBG MONITORING, ED - Abnormal; Notable for the following components:   Glucose-Capillary 115 (*)    All other components within normal limits  ETHANOL  PROTIME-INR  APTT  DIFFERENTIAL  RAPID URINE DRUG SCREEN, HOSP PERFORMED  I-STAT CHEM 8, ED    EKG None  Radiology MR BRAIN WO CONTRAST  Result Date: 03/23/2023 CLINICAL DATA:  Acute neurologic deficit.  Dizziness. EXAM: MRI HEAD WITHOUT CONTRAST TECHNIQUE: Multiplanar, multiecho pulse sequences of the brain and surrounding structures were obtained without intravenous contrast. COMPARISON:  None Available. FINDINGS: Brain: No acute infarct, mass effect or extra-axial collection. No chronic microhemorrhage or siderosis. There is multifocal hyperintense T2-weighted signal within the white matter. Parenchymal volume and CSF spaces are normal. The midline structures are normal. Vascular: Normal flow voids. Skull and upper cervical spine: Normal marrow signal. Sinuses/Orbits: Negative. Other: None. IMPRESSION: 1. No acute intracranial abnormality. 2. Findings of chronic microvascular ischemia. Electronically Signed   By: Deatra Robinson M.D.   On: 03/23/2023 20:30    Procedures Procedures    Medications Ordered in ED Medications  sodium chloride 0.9 % bolus 500 mL (500 mLs Intravenous New Bag/Given 03/23/23 1901)  ondansetron (ZOFRAN) injection 4 mg (4 mg Intravenous Given 03/23/23 1900)  meclizine (ANTIVERT) tablet 25 mg (25 mg Oral Given 03/23/23 1901)    ED Course/ Medical Decision Making/ A&P   This patient  presents to the ED for concern of vertigo, this involves an extensive number of treatment options, and is a complaint that carries with it a high risk of complications and morbidity.  The differential diagnosis includes stroke, inner ear vertigo   Co morbidities that complicate the patient evaluation  Hypertension   Additional history obtained:  Additional history obtained from patient External records from outside source obtained and reviewed including hospital records   Lab Tests:  I Ordered, and personally interpreted labs.  The pertinent results include: Hemoglobin 17.7   Imaging Studies ordered:  I ordered imaging studies including MRI of brain I independently visualized and interpreted imaging which showed negative I agree with the radiologist interpretation   Cardiac Monitoring: / EKG:  The patient was maintained on a  cardiac monitor.  I personally viewed and interpreted the cardiac monitored which showed an underlying rhythm of: Normal sinus rhythm   Consultations Obtained:  No consultant  Problem List / ED Course / Critical interventions / Medication management  Hypertension and vertigo I ordered medication including Antivert for vertigo Reevaluation of the patient after these medicines showed that the patient improved I have reviewed the patients home medicines and have made adjustments as needed   Social Determinants of Health:  None   Test / Admission - Considered:  None                                Medical Decision Making Amount and/or Complexity of Data Reviewed Labs: ordered. Radiology: ordered.  Risk Prescription drug management.   Patient with vertigo.  He is prescribed Antivert and will follow-up with PCP        Final Clinical Impression(s) / ED Diagnoses Final diagnoses:  Vertigo    Rx / DC Orders ED Discharge Orders          Ordered    meclizine (ANTIVERT) 25 MG tablet  3 times daily PRN        03/23/23 2108               Bethann Berkshire, MD 03/24/23 1151

## 2023-03-23 NOTE — ED Notes (Signed)
Per Dr. Estell Harpin, not a code stroke

## 2023-03-23 NOTE — Discharge Instructions (Signed)
Follow-up with your family doctor or another doctor next week for recheck if not improving

## 2023-03-23 NOTE — ED Notes (Addendum)
Says he went to bed around midnnight feeling normal and woke up at 0430 to use restroom and suddenly felt dizzy. After a while he says he felt better but after making coffee and looking in the cabinets he had a presyncope feeling.   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   Denies any complaint at this time. Spouse accompanying him at bedside.
